# Patient Record
Sex: Female | Born: 1955 | Race: White | Hispanic: No | Marital: Single | Smoking: Former smoker
Health system: Southern US, Community
[De-identification: ages and names within clinical notes are randomized; demographics above are authoritative.]

## PROBLEM LIST (undated history)

## (undated) DIAGNOSIS — I10 Essential (primary) hypertension: Secondary | ICD-10-CM

## (undated) DIAGNOSIS — E119 Type 2 diabetes mellitus without complications: Secondary | ICD-10-CM

## (undated) DIAGNOSIS — E039 Hypothyroidism, unspecified: Secondary | ICD-10-CM

## (undated) DIAGNOSIS — Z9889 Other specified postprocedural states: Secondary | ICD-10-CM

## (undated) DIAGNOSIS — T7840XA Allergy, unspecified, initial encounter: Secondary | ICD-10-CM

## (undated) DIAGNOSIS — R112 Nausea with vomiting, unspecified: Secondary | ICD-10-CM

## (undated) DIAGNOSIS — D649 Anemia, unspecified: Secondary | ICD-10-CM

## (undated) DIAGNOSIS — E785 Hyperlipidemia, unspecified: Secondary | ICD-10-CM

## (undated) DIAGNOSIS — M1990 Unspecified osteoarthritis, unspecified site: Secondary | ICD-10-CM

## (undated) DIAGNOSIS — F329 Major depressive disorder, single episode, unspecified: Secondary | ICD-10-CM

## (undated) DIAGNOSIS — F419 Anxiety disorder, unspecified: Secondary | ICD-10-CM

## (undated) DIAGNOSIS — H269 Unspecified cataract: Secondary | ICD-10-CM

## (undated) DIAGNOSIS — F32A Depression, unspecified: Secondary | ICD-10-CM

## (undated) DIAGNOSIS — K219 Gastro-esophageal reflux disease without esophagitis: Secondary | ICD-10-CM

## (undated) DIAGNOSIS — R519 Headache, unspecified: Secondary | ICD-10-CM

## (undated) DIAGNOSIS — D751 Secondary polycythemia: Principal | ICD-10-CM

## (undated) DIAGNOSIS — H35039 Hypertensive retinopathy, unspecified eye: Secondary | ICD-10-CM

## (undated) DIAGNOSIS — E079 Disorder of thyroid, unspecified: Secondary | ICD-10-CM

## (undated) DIAGNOSIS — R51 Headache: Secondary | ICD-10-CM

## (undated) DIAGNOSIS — R002 Palpitations: Principal | ICD-10-CM

## (undated) HISTORY — PX: COLONOSCOPY: SHX174

## (undated) HISTORY — PX: BUNIONECTOMY: SHX129

## (undated) HISTORY — DX: Disorder of thyroid, unspecified: E07.9

## (undated) HISTORY — PX: TONSILLECTOMY: SUR1361

## (undated) HISTORY — DX: Gastro-esophageal reflux disease without esophagitis: K21.9

## (undated) HISTORY — DX: Depression, unspecified: F32.A

## (undated) HISTORY — DX: Unspecified osteoarthritis, unspecified site: M19.90

## (undated) HISTORY — PX: POLYPECTOMY: SHX149

## (undated) HISTORY — DX: Secondary polycythemia: D75.1

## (undated) HISTORY — DX: Anxiety disorder, unspecified: F41.9

## (undated) HISTORY — DX: Unspecified cataract: H26.9

## (undated) HISTORY — DX: Allergy, unspecified, initial encounter: T78.40XA

## (undated) HISTORY — DX: Essential (primary) hypertension: I10

## (undated) HISTORY — DX: Hyperlipidemia, unspecified: E78.5

## (undated) HISTORY — DX: Type 2 diabetes mellitus without complications: E11.9

## (undated) HISTORY — DX: Palpitations: R00.2

## (undated) HISTORY — DX: Hypertensive retinopathy, unspecified eye: H35.039

## (undated) HISTORY — DX: Major depressive disorder, single episode, unspecified: F32.9

---

## 1997-09-03 ENCOUNTER — Other Ambulatory Visit: Admission: RE | Admit: 1997-09-03 | Discharge: 1997-09-03 | Payer: Self-pay | Admitting: *Deleted

## 1998-01-19 ENCOUNTER — Ambulatory Visit (HOSPITAL_COMMUNITY): Admission: RE | Admit: 1998-01-19 | Discharge: 1998-01-19 | Payer: Self-pay | Admitting: *Deleted

## 1998-01-25 ENCOUNTER — Ambulatory Visit (HOSPITAL_COMMUNITY): Admission: RE | Admit: 1998-01-25 | Discharge: 1998-01-25 | Payer: Self-pay | Admitting: *Deleted

## 1998-07-05 ENCOUNTER — Encounter: Payer: Self-pay | Admitting: Internal Medicine

## 1998-07-05 ENCOUNTER — Ambulatory Visit (HOSPITAL_COMMUNITY): Admission: RE | Admit: 1998-07-05 | Discharge: 1998-07-05 | Payer: Self-pay | Admitting: Internal Medicine

## 1998-08-04 ENCOUNTER — Ambulatory Visit (HOSPITAL_COMMUNITY): Admission: RE | Admit: 1998-08-04 | Discharge: 1998-08-04 | Payer: Self-pay | Admitting: *Deleted

## 1998-09-23 ENCOUNTER — Other Ambulatory Visit: Admission: RE | Admit: 1998-09-23 | Discharge: 1998-09-23 | Payer: Self-pay | Admitting: *Deleted

## 1999-03-15 ENCOUNTER — Ambulatory Visit (HOSPITAL_COMMUNITY): Admission: RE | Admit: 1999-03-15 | Discharge: 1999-03-15 | Payer: Self-pay | Admitting: *Deleted

## 1999-10-23 ENCOUNTER — Other Ambulatory Visit: Admission: RE | Admit: 1999-10-23 | Discharge: 1999-10-23 | Payer: Self-pay | Admitting: *Deleted

## 2000-04-01 ENCOUNTER — Encounter: Payer: Self-pay | Admitting: Internal Medicine

## 2000-04-01 ENCOUNTER — Ambulatory Visit (HOSPITAL_COMMUNITY): Admission: RE | Admit: 2000-04-01 | Discharge: 2000-04-01 | Payer: Self-pay | Admitting: Internal Medicine

## 2001-01-02 ENCOUNTER — Other Ambulatory Visit: Admission: RE | Admit: 2001-01-02 | Discharge: 2001-01-02 | Payer: Self-pay | Admitting: Internal Medicine

## 2001-01-06 ENCOUNTER — Encounter: Payer: Self-pay | Admitting: Internal Medicine

## 2001-01-06 ENCOUNTER — Ambulatory Visit (HOSPITAL_COMMUNITY): Admission: RE | Admit: 2001-01-06 | Discharge: 2001-01-06 | Payer: Self-pay | Admitting: Internal Medicine

## 2001-04-03 ENCOUNTER — Ambulatory Visit (HOSPITAL_COMMUNITY): Admission: RE | Admit: 2001-04-03 | Discharge: 2001-04-03 | Payer: Self-pay | Admitting: Internal Medicine

## 2001-04-03 ENCOUNTER — Encounter: Payer: Self-pay | Admitting: Internal Medicine

## 2001-04-10 ENCOUNTER — Encounter: Payer: Self-pay | Admitting: Internal Medicine

## 2001-04-10 ENCOUNTER — Encounter: Admission: RE | Admit: 2001-04-10 | Discharge: 2001-04-10 | Payer: Self-pay | Admitting: Internal Medicine

## 2002-01-05 ENCOUNTER — Other Ambulatory Visit: Admission: RE | Admit: 2002-01-05 | Discharge: 2002-01-05 | Payer: Self-pay | Admitting: Internal Medicine

## 2002-04-13 ENCOUNTER — Encounter: Payer: Self-pay | Admitting: Internal Medicine

## 2002-04-13 ENCOUNTER — Encounter: Admission: RE | Admit: 2002-04-13 | Discharge: 2002-04-13 | Payer: Self-pay | Admitting: Internal Medicine

## 2004-01-11 ENCOUNTER — Encounter: Admission: RE | Admit: 2004-01-11 | Discharge: 2004-01-11 | Payer: Self-pay | Admitting: Internal Medicine

## 2004-02-02 ENCOUNTER — Ambulatory Visit: Admission: RE | Admit: 2004-02-02 | Discharge: 2004-02-02 | Payer: Self-pay | Admitting: Internal Medicine

## 2004-02-10 ENCOUNTER — Ambulatory Visit (HOSPITAL_COMMUNITY): Admission: RE | Admit: 2004-02-10 | Discharge: 2004-02-10 | Payer: Self-pay | Admitting: Internal Medicine

## 2004-05-24 ENCOUNTER — Other Ambulatory Visit: Admission: RE | Admit: 2004-05-24 | Discharge: 2004-05-24 | Payer: Self-pay | Admitting: Internal Medicine

## 2005-01-15 HISTORY — PX: GASTRIC BYPASS: SHX52

## 2005-03-06 ENCOUNTER — Encounter: Admission: RE | Admit: 2005-03-06 | Discharge: 2005-03-06 | Payer: Self-pay | Admitting: Internal Medicine

## 2005-10-20 ENCOUNTER — Emergency Department (HOSPITAL_COMMUNITY): Admission: EM | Admit: 2005-10-20 | Discharge: 2005-10-20 | Payer: Self-pay | Admitting: Emergency Medicine

## 2006-04-08 ENCOUNTER — Other Ambulatory Visit: Admission: RE | Admit: 2006-04-08 | Discharge: 2006-04-08 | Payer: Self-pay | Admitting: Internal Medicine

## 2006-05-24 ENCOUNTER — Encounter: Admission: RE | Admit: 2006-05-24 | Discharge: 2006-05-24 | Payer: Self-pay | Admitting: Internal Medicine

## 2007-05-26 ENCOUNTER — Encounter: Admission: RE | Admit: 2007-05-26 | Discharge: 2007-05-26 | Payer: Self-pay | Admitting: Internal Medicine

## 2007-10-30 ENCOUNTER — Emergency Department (HOSPITAL_COMMUNITY): Admission: EM | Admit: 2007-10-30 | Discharge: 2007-10-30 | Payer: Self-pay | Admitting: Family Medicine

## 2008-02-02 ENCOUNTER — Ambulatory Visit (HOSPITAL_COMMUNITY): Admission: RE | Admit: 2008-02-02 | Discharge: 2008-02-02 | Payer: Self-pay | Admitting: Internal Medicine

## 2008-06-07 ENCOUNTER — Encounter: Admission: RE | Admit: 2008-06-07 | Discharge: 2008-06-07 | Payer: Self-pay | Admitting: Internal Medicine

## 2008-06-30 ENCOUNTER — Other Ambulatory Visit: Admission: RE | Admit: 2008-06-30 | Discharge: 2008-06-30 | Payer: Self-pay | Admitting: Internal Medicine

## 2009-12-27 ENCOUNTER — Encounter
Admission: RE | Admit: 2009-12-27 | Discharge: 2009-12-27 | Payer: Self-pay | Source: Home / Self Care | Attending: Internal Medicine | Admitting: Internal Medicine

## 2010-10-10 ENCOUNTER — Other Ambulatory Visit (HOSPITAL_COMMUNITY)
Admission: RE | Admit: 2010-10-10 | Discharge: 2010-10-10 | Disposition: A | Discharge status: Home / Self Care | Payer: BC Managed Care – PPO | Source: Ambulatory Visit | Attending: Family Medicine | Admitting: Family Medicine

## 2010-10-10 DIAGNOSIS — Z124 Encounter for screening for malignant neoplasm of cervix: Secondary | ICD-10-CM | POA: Insufficient documentation

## 2011-01-15 ENCOUNTER — Other Ambulatory Visit: Payer: Self-pay | Admitting: Family Medicine

## 2011-01-15 DIAGNOSIS — Z1231 Encounter for screening mammogram for malignant neoplasm of breast: Secondary | ICD-10-CM

## 2011-02-01 ENCOUNTER — Ambulatory Visit
Admission: RE | Admit: 2011-02-01 | Discharge: 2011-02-01 | Disposition: A | Discharge status: Home / Self Care | Payer: BC Managed Care – PPO | Source: Ambulatory Visit | Attending: Family Medicine | Admitting: Family Medicine

## 2011-02-01 DIAGNOSIS — Z1231 Encounter for screening mammogram for malignant neoplasm of breast: Secondary | ICD-10-CM

## 2012-08-08 ENCOUNTER — Other Ambulatory Visit: Payer: Self-pay

## 2012-08-08 DIAGNOSIS — Z1231 Encounter for screening mammogram for malignant neoplasm of breast: Secondary | ICD-10-CM

## 2012-08-22 ENCOUNTER — Ambulatory Visit
Admission: RE | Admit: 2012-08-22 | Discharge: 2012-08-22 | Disposition: A | Discharge status: Home / Self Care | Payer: BC Managed Care – PPO | Source: Ambulatory Visit

## 2012-08-22 DIAGNOSIS — Z1231 Encounter for screening mammogram for malignant neoplasm of breast: Secondary | ICD-10-CM

## 2012-08-26 ENCOUNTER — Other Ambulatory Visit: Payer: Self-pay | Admitting: Family Medicine

## 2012-08-26 DIAGNOSIS — R928 Other abnormal and inconclusive findings on diagnostic imaging of breast: Secondary | ICD-10-CM

## 2012-09-12 ENCOUNTER — Ambulatory Visit
Admission: RE | Admit: 2012-09-12 | Discharge: 2012-09-12 | Disposition: A | Discharge status: Home / Self Care | Payer: BC Managed Care – PPO | Source: Ambulatory Visit | Attending: Family Medicine | Admitting: Family Medicine

## 2012-09-12 DIAGNOSIS — R928 Other abnormal and inconclusive findings on diagnostic imaging of breast: Secondary | ICD-10-CM

## 2012-11-28 ENCOUNTER — Ambulatory Visit (INDEPENDENT_AMBULATORY_CARE_PROVIDER_SITE_OTHER): Payer: BC Managed Care – PPO

## 2012-11-28 VITALS — BP 124/72 | HR 84 | Resp 16 | Ht 65.0 in | Wt 240.0 lb

## 2012-11-28 DIAGNOSIS — M79609 Pain in unspecified limb: Secondary | ICD-10-CM

## 2012-11-28 DIAGNOSIS — M722 Plantar fascial fibromatosis: Secondary | ICD-10-CM

## 2012-11-28 DIAGNOSIS — M7750 Other enthesopathy of unspecified foot: Secondary | ICD-10-CM

## 2012-11-28 DIAGNOSIS — B351 Tinea unguium: Secondary | ICD-10-CM

## 2012-11-28 NOTE — Patient Instructions (Signed)

## 2012-11-28 NOTE — Progress Notes (Signed)
  Subjective:    Patient ID: Debbie Cochran, female    DOB: 02/27/1955, 57 y.o.   MRN: [id number] "I need to get new orthotics.  He wanted me to wait until my foot healed from Bunion surgery."  HPI no changes medication her health history    Review of Systems  Constitutional: Negative.   Respiratory: Negative.   Cardiovascular: Negative.   Gastrointestinal: Negative.   Endocrine: Negative.   Musculoskeletal: Negative.   Skin: Negative.   Allergic/Immunologic: Negative.   Neurological: Negative.   Hematological: Negative.   Psychiatric/Behavioral: Negative.   All other systems reviewed and are negative.       Objective:   Physical Exam Neurovascular status is intact with pedal pulses palpable. DP and PT postop over 4 bilateral. Epicritic and proprioceptive sensations intact and symmetric. There is pain along the mid arch plantar fascia right heel. There is also tenderness and discomfort Lisfranc's and mid tarsus area dorsal lateral aspect lateral column of the right foot in particular. Consistent with Lisfranc capsulitis arthropathy. Patient is been orthotics which are worn a knee replacement at this time. Patient is status post [place] bunionectomy right foot with good success good range of motion or flexion plantar flexion of the right great toe joint noted at this time. Well coapted incision.       Assessment & Plan:  Assessment good postop progress following bunionectomy right foot. 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 Plan at this time orthotics skin for new functional orthoses carried out consider some a flexible type device with Spenco top cover. Patient be contacted with the next 3-4 weeks when orthotics ready for fitting and dispensing in the interim maintain accommodative shoes wearing a 6 athletic shoes at the current time.  Alvan Dame DPM

## 2013-01-14 ENCOUNTER — Ambulatory Visit (INDEPENDENT_AMBULATORY_CARE_PROVIDER_SITE_OTHER): Payer: BC Managed Care – PPO

## 2013-01-14 VITALS — BP 122/69 | HR 85 | Resp 16 | Ht 65.0 in | Wt 235.0 lb

## 2013-01-14 DIAGNOSIS — M79609 Pain in unspecified limb: Secondary | ICD-10-CM

## 2013-01-14 DIAGNOSIS — M722 Plantar fascial fibromatosis: Secondary | ICD-10-CM

## 2013-01-14 DIAGNOSIS — M7750 Other enthesopathy of unspecified foot: Secondary | ICD-10-CM

## 2013-01-14 NOTE — Patient Instructions (Signed)

## 2013-01-14 NOTE — Progress Notes (Signed)
   Subjective:    Patient ID: Debbie Cochran, female    DOB: [birth year], 57 y.o.   MRN: [id number]  HPI "It's good."  Patient presents to pick up orthotics, instructions were given.    Review of Systems deferred at this     Objective:   Physical Exam Neurovascular status intact pedal pulses palpable DP and PT +2/4 bilateral Refill timed 3-4 seconds. There is tenderness on palpation Magan plantar fascia and Lisfranc capsulitis bilateral. Patient settled orthotics the orthotics fit and contour well at this time patient does have some dress shoes correction to narrow on her patient is a bunion deformity left as opposed right and needs wider more coming shoes. Orthotics are dispensed with break in wearing instructions at this time.       Assessment & Plan:  Assessment plantar fasciitis/heel spur syndrome as well as Lisfranc's capsulitis bilateral orthotics are dispensed with instructions for break-in use recheck in one to 2 months for adjustments if needed. Recommended Tylenol or Advil during braking in an as-needed basis. Also suggested some new shoes and take the orthotics with her which is fitted for new shoes.  Alvan Dame DPM

## 2013-08-12 ENCOUNTER — Other Ambulatory Visit: Payer: Self-pay

## 2013-08-12 DIAGNOSIS — Z1231 Encounter for screening mammogram for malignant neoplasm of breast: Secondary | ICD-10-CM

## 2013-08-14 ENCOUNTER — Other Ambulatory Visit: Payer: Self-pay | Admitting: Family Medicine

## 2013-08-14 ENCOUNTER — Other Ambulatory Visit (HOSPITAL_COMMUNITY)
Admission: RE | Admit: 2013-08-14 | Discharge: 2013-08-14 | Disposition: A | Discharge status: Home / Self Care | Payer: BC Managed Care – PPO | Source: Ambulatory Visit | Attending: Family Medicine | Admitting: Family Medicine

## 2013-08-14 DIAGNOSIS — Z124 Encounter for screening for malignant neoplasm of cervix: Secondary | ICD-10-CM | POA: Insufficient documentation

## 2013-08-18 ENCOUNTER — Telehealth: Payer: Self-pay | Admitting: Hematology and Oncology

## 2013-08-18 LAB — CYTOLOGY - PAP

## 2013-08-18 NOTE — Telephone Encounter (Signed)
LEFT MESSAGE FOR PATIENT AND GAVE NP APPT FOR 08/18 @ 11 W/DR. [place].

## 2013-08-27 ENCOUNTER — Ambulatory Visit: Payer: BC Managed Care – PPO

## 2013-08-31 ENCOUNTER — Ambulatory Visit
Admission: RE | Admit: 2013-08-31 | Discharge: 2013-08-31 | Disposition: A | Discharge status: Home / Self Care | Payer: BC Managed Care – PPO | Source: Ambulatory Visit

## 2013-08-31 DIAGNOSIS — Z1231 Encounter for screening mammogram for malignant neoplasm of breast: Secondary | ICD-10-CM

## 2013-09-01 ENCOUNTER — Ambulatory Visit: Payer: BC Managed Care – PPO

## 2013-09-01 ENCOUNTER — Ambulatory Visit: Payer: BC Managed Care – PPO | Admitting: Hematology and Oncology

## 2013-09-10 ENCOUNTER — Ambulatory Visit: Payer: BC Managed Care – PPO

## 2013-09-10 ENCOUNTER — Encounter: Payer: Self-pay | Admitting: Hematology and Oncology

## 2013-09-10 ENCOUNTER — Telehealth: Payer: Self-pay | Admitting: Hematology and Oncology

## 2013-09-10 ENCOUNTER — Ambulatory Visit (HOSPITAL_BASED_OUTPATIENT_CLINIC_OR_DEPARTMENT_OTHER): Payer: BC Managed Care – PPO | Admitting: Hematology and Oncology

## 2013-09-10 ENCOUNTER — Ambulatory Visit (HOSPITAL_BASED_OUTPATIENT_CLINIC_OR_DEPARTMENT_OTHER): Payer: BC Managed Care – PPO

## 2013-09-10 VITALS — BP 143/67 | HR 84 | Temp 99.0°F | Resp 17 | Ht 65.0 in | Wt 247.1 lb

## 2013-09-10 DIAGNOSIS — Z9884 Bariatric surgery status: Secondary | ICD-10-CM

## 2013-09-10 DIAGNOSIS — D751 Secondary polycythemia: Secondary | ICD-10-CM

## 2013-09-10 HISTORY — DX: Secondary polycythemia: D75.1

## 2013-09-10 LAB — CBC & DIFF AND RETIC
BASO%: 0.4 % (ref 0.0–2.0)
Basophils Absolute: 0 10*3/uL (ref 0.0–0.1)
EOS ABS: 0.4 10*3/uL (ref 0.0–0.5)
EOS%: 3.6 % (ref 0.0–7.0)
HCT: 43.4 % (ref 34.8–46.6)
HGB: 14.9 g/dL (ref 11.6–15.9)
Immature Retic Fract: 6.7 % (ref 1.60–10.00)
LYMPH%: 37.3 % (ref 14.0–49.7)
MCH: 30.6 pg (ref 25.1–34.0)
MCHC: 34.3 g/dL (ref 31.5–36.0)
MCV: 89.1 fL (ref 79.5–101.0)
MONO#: 0.8 10*3/uL (ref 0.1–0.9)
MONO%: 8 % (ref 0.0–14.0)
NEUT%: 50.7 % (ref 38.4–76.8)
NEUTROS ABS: 5 10*3/uL (ref 1.5–6.5)
Platelets: 323 10*3/uL (ref 145–400)
RBC: 4.87 10*6/uL (ref 3.70–5.45)
RDW: 11.9 % (ref 11.2–14.5)
RETIC %: 2.36 % — AB (ref 0.70–2.10)
RETIC CT ABS: 114.93 10*3/uL — AB (ref 33.70–90.70)
WBC: 10 10*3/uL (ref 3.9–10.3)
lymph#: 3.7 10*3/uL — ABNORMAL HIGH (ref 0.9–3.3)

## 2013-09-10 NOTE — Assessment & Plan Note (Signed)
The cause is unknown, could be due to undiagnosed obstructive sleep apnea. At the time of dictation, her repeat CBC came back within normal limits. I am waiting for her iron studies. We'll call the patient with tess results.

## 2013-09-10 NOTE — Progress Notes (Signed)
Checked in new pt with no financial concerns at this time.  Pt informed me that she is here for a blood issue so no financial help is needed.  I gave her Raquel's card for any questions or concerns.

## 2013-09-10 NOTE — Assessment & Plan Note (Signed)
She is currently taking iron replacement therapy. I will call the patient with test results.

## 2013-09-10 NOTE — Progress Notes (Signed)
[facility] NOTE  Patient Care Team: Gerrit Heck, MD as PCP - General (Family Medicine)  CHIEF COMPLAINTS/PURPOSE OF CONSULTATION:  Erythrocytosis  HISTORY OF PRESENTING ILLNESS:  Debbie Cochran 58 y.o. female is here because of elevated hemoglobin.  She was found to have abnormal CBC from routine blood work monitoring. She had history of gastric bypass and was noted to have severe pain deficiency anemia last year. She was placed on oral iron supplements and underwent EGD and colonoscopy with no source of bleeding found. Recently, she was told to have polycythemia the hemoglobin at 16.4. She denies intermittent headaches, shortness of breath on exertion, frequent leg cramps and occasional chest pain.  She never suffer from diagnosis of blood clot.  There is no prior diagnosis of obstructive sleep apnea. The patient denies weight loss or skin itching. MEDICAL HISTORY:  Past Medical History  Diagnosis Date  . Allergy   . Thyroid disease   . GERD (gastroesophageal reflux disease)   . Hypertension   . Hyperlipidemia   . Depression   . Anxiety   . Arthritis   . Polycythemia, secondary 09/10/2013    SURGICAL HISTORY: Past Surgical History  Procedure Laterality Date  . Bunionectomy Right   . 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 Tonsillectomy      SOCIAL HISTORY: History   Social History  . Marital Status: Single    Spouse Name: N/A    Number of Children: N/A  . Years of Education: N/A   Occupational History  . Not on file.   Social History Main Topics  . Smoking status: Former Smoker -- 0.01 packs/day for 35 years    Types: Cigarettes    Quit date: 06/15/2013  . Smokeless tobacco: Never Used  . Alcohol Use: Yes     Comment: once in a while not daily  . Drug Use: No  . Sexual Activity: Not on file   Other Topics Concern  . Not on file   Social History Narrative  . No narrative on file    FAMILY HISTORY: Family History   Problem Relation Age of Onset  . Dementia Mother   . Dementia Father   . Arthritis Father     ALLERGIES:  is allergic to penicillins.  MEDICATIONS:  Current Outpatient Prescriptions  Medication Sig Dispense Refill  . acetaminophen (TYLENOL) 500 MG tablet Take 1,000 mg by mouth daily.      Marland Kitchen ALPRAZolam (XANAX) 0.25 MG tablet Take 0.25 mg by mouth as needed for anxiety.      Marland Kitchen b complex vitamins tablet Take 1 tablet by mouth daily.      . Calcium Carb-Cholecalciferol (CALCIUM 600 + D PO) Take by mouth daily.      . cetirizine-pseudoephedrine (ZYRTEC-D) 5-120 MG per tablet Take 1 tablet by mouth daily as needed for allergies.      . cholecalciferol (VITAMIN D) 1000 UNITS tablet Take 1,000 Units by mouth daily.      . DULoxetine (CYMBALTA) 60 MG capsule Take 60 mg by mouth daily.      . ferrous sulfate 325 (65 FE) MG tablet Take 325 mg by mouth 2 (two) times daily with a meal.      . fluticasone (VERAMYST) 27.5 MCG/SPRAY nasal spray Place 2 sprays into the nose daily.      . folic acid (FOLVITE) 161 MCG tablet Take 400 mcg by mouth daily.      Marland Kitchen levothyroxine (SYNTHROID,  LEVOTHROID) 75 MCG tablet Take 75 mcg by mouth daily before breakfast.      . lovastatin (MEVACOR) 20 MG tablet Take 20 mg by mouth at bedtime.      . magnesium gluconate (MAGONATE) 500 MG tablet Take 500 mg by mouth daily.      . Multiple Vitamin (MULTIVITAMIN) tablet Take 1 tablet by mouth daily.      Marland Kitchen omeprazole (PRILOSEC) 20 MG capsule Take 20 mg by mouth daily.      . trandolapril (MAVIK) 2 MG tablet Take 2 mg by mouth daily.      Marland Kitchen triamterene-hydrochlorothiazide (MAXZIDE) 75-50 MG per tablet Take 0.5 tablets by mouth daily.       . verapamil (COVERA HS) 240 MG (CO) 24 hr tablet Take 240 mg by mouth daily after breakfast.      . vitamin C (ASCORBIC ACID) 500 MG tablet Take 500 mg by mouth daily.       No current facility-administered medications for this visit.    REVIEW OF SYSTEMS:   Constitutional: Denies  fevers, chills or abnormal night sweats Eyes: Denies blurriness of vision, double vision or watery eyes Ears, nose, mouth, throat, and face: Denies mucositis or sore throat Respiratory: Denies cough, dyspnea or wheezes Cardiovascular: Denies palpitation, chest discomfort or lower extremity swelling Gastrointestinal:  Denies nausea, heartburn or change in bowel habits Skin: Denies abnormal skin rashes Lymphatics: Denies new lymphadenopathy or easy bruising Neurological:Denies numbness, tingling or new weaknesses Behavioral/Psych: Mood is stable, no new changes  All other systems were reviewed with the patient and are negative.  PHYSICAL EXAMINATION: ECOG PERFORMANCE STATUS: 0 - Asymptomatic  Filed Vitals:   09/10/13 1440  BP: 143/67  Pulse: 84  Temp: 99 F (37.2 C)  Resp: 17   Filed Weights   09/10/13 1440  Weight: 247 lb 1.6 oz (112.084 kg)    GENERAL:alert, no distress and comfortable. She is morbidly obese SKIN: skin color, texture, turgor are normal, no rashes or significant lesions EYES: normal, conjunctiva are pink and non-injected, sclera clear OROPHARYNX:no exudate, no erythema and lips, buccal mucosa, and tongue normal  NECK: supple, thyroid normal size, non-tender, without nodularity LYMPH:  no palpable lymphadenopathy in the cervical, axillary or inguinal LUNGS: clear to auscultation and percussion with normal breathing effort HEART: regular rate & rhythm and no murmurs and no lower extremity edema ABDOMEN:abdomen soft, non-tender and normal bowel sounds Musculoskeletal:no cyanosis of digits and no clubbing  PSYCH: alert & oriented x 3 with fluent speech NEURO: no focal motor/sensory deficits  LABORATORY DATA:  I have reviewed the data as listed Recent Results (from the past 2160 hour(s))  CYTOLOGY - PAP     Status: None   Collection Time    08/14/13 12:00 AM      Result Value Ref Range   CYTOLOGY - PAP PAP RESULT    CBC & DIFF AND RETIC     Status: Abnormal    Collection Time    09/10/13  3:34 PM      Result Value Ref Range   WBC 10.0  3.9 - 10.3 10e3/uL   NEUT# 5.0  1.5 - 6.5 10e3/uL   HGB 14.9  11.6 - 15.9 g/dL   HCT 43.4  34.8 - 46.6 %   Platelets 323  145 - 400 10e3/uL   MCV 89.1  79.5 - 101.0 fL   MCH 30.6  25.1 - 34.0 pg   MCHC 34.3  31.5 - 36.0 g/dL   RBC 4.87  3.70 - 5.45 10e6/uL   RDW 11.9  11.2 - 14.5 %   lymph# 3.7 (*) 0.9 - 3.3 10e3/uL   MONO# 0.8  0.1 - 0.9 10e3/uL   Eosinophils Absolute 0.4  0.0 - 0.5 10e3/uL   Basophils Absolute 0.0  0.0 - 0.1 10e3/uL   NEUT% 50.7  38.4 - 76.8 %   LYMPH% 37.3  14.0 - 49.7 %   MONO% 8.0  0.0 - 14.0 %   EOS% 3.6  0.0 - 7.0 %   BASO% 0.4  0.0 - 2.0 %   Retic % 2.36 (*) 0.70 - 2.10 %   Retic Ct Abs 114.93 (*) 33.70 - 90.70 10e3/uL   Immature Retic Fract 6.70  1.60 - 10.00 %    RADIOGRAPHIC STUDIES: I have personally reviewed the radiological images as listed and agreed with the findings in the report. Mm Digital Screening Bilateral  09/01/2013   CLINICAL DATA:  Screening. Family history of breast cancer diagnosed in her sister at 48 and her mother at 70.  EXAM: DIGITAL SCREENING BILATERAL MAMMOGRAM WITH CAD  COMPARISON:  Previous exam(s).  ACR Breast Density Category b: There are scattered areas of fibroglandular density.  FINDINGS: There are no findings suspicious for malignancy. Images were processed with CAD.  IMPRESSION: 1. No mammographic evidence of malignancy. 2. Based on the Tyrer-Cuzick risk assessment model, the patient has a 22.2% lifetime risk of developing breast cancer. By the recommendations of the [place], annual MRI and annual mammography are recommended. 3. A result letter of this screening mammogram will be mailed directly to the patient.  RECOMMENDATION: 1. Screening mammogram in one year. (Code:SM-B-01Y) 2. Annual MRI is recommended.  See above  BI-RADS CATEGORY  1: Negative.   Electronically Signed   By: Shon Hale M.D.   On: 09/01/2013 13:48     ASSESSMENT & PLAN Polycythemia, secondary The cause is unknown, could be due to undiagnosed obstructive sleep apnea. At the time of dictation, her repeat CBC came back within normal limits. I am waiting for her iron studies. We'll call the patient with tess results.   S/P gastric bypass She is currently taking iron replacement therapy. I will call the patient with test results.

## 2013-09-10 NOTE — Telephone Encounter (Signed)
gv and printed appt sched and av sfo rpt for Sept....sent pt to lab °

## 2013-09-11 LAB — FERRITIN CHCC: FERRITIN: 205 ng/mL (ref 9–269)

## 2013-09-14 ENCOUNTER — Telehealth: Payer: Self-pay | Admitting: Hematology and Oncology

## 2013-09-14 NOTE — Telephone Encounter (Signed)
I spoke with the patient and deliver test results over the phone. Polycythemia has resolved. Ferritin level is over 200 and I recommend she can stop aspirin and iron supplement. I recommend we check blood work as PCP in 6 months. I address all her questions and concerns.

## 2013-09-22 ENCOUNTER — Ambulatory Visit: Payer: BC Managed Care – PPO | Admitting: Hematology and Oncology

## 2014-07-27 ENCOUNTER — Other Ambulatory Visit: Payer: Self-pay

## 2014-07-27 DIAGNOSIS — Z1231 Encounter for screening mammogram for malignant neoplasm of breast: Secondary | ICD-10-CM

## 2014-09-03 ENCOUNTER — Ambulatory Visit
Admission: RE | Admit: 2014-09-03 | Discharge: 2014-09-03 | Disposition: A | Discharge status: Home / Self Care | Payer: BC Managed Care – PPO | Source: Ambulatory Visit

## 2014-09-03 DIAGNOSIS — Z1231 Encounter for screening mammogram for malignant neoplasm of breast: Secondary | ICD-10-CM

## 2014-09-21 ENCOUNTER — Other Ambulatory Visit: Payer: Self-pay | Admitting: Obstetrics and Gynecology

## 2014-09-21 ENCOUNTER — Other Ambulatory Visit (HOSPITAL_COMMUNITY)
Admission: RE | Admit: 2014-09-21 | Discharge: 2014-09-21 | Disposition: A | Discharge status: Home / Self Care | Payer: BC Managed Care – PPO | Source: Ambulatory Visit | Attending: Obstetrics and Gynecology | Admitting: Obstetrics and Gynecology

## 2014-09-21 DIAGNOSIS — Z1151 Encounter for screening for human papillomavirus (HPV): Secondary | ICD-10-CM | POA: Insufficient documentation

## 2014-09-21 DIAGNOSIS — Z01411 Encounter for gynecological examination (general) (routine) with abnormal findings: Secondary | ICD-10-CM | POA: Diagnosis present

## 2014-09-23 LAB — CYTOLOGY - PAP

## 2014-10-06 ENCOUNTER — Other Ambulatory Visit: Payer: Self-pay | Admitting: Obstetrics and Gynecology

## 2015-01-03 ENCOUNTER — Telehealth: Payer: Self-pay | Admitting: Cardiovascular Disease

## 2015-01-03 NOTE — Telephone Encounter (Signed)
Received records from [place] for appointment on 01/21/15 with Dr Oval Linsey.  Records given to [facility] (medical records) for Dr Blenda Mounts schedule on 01/21/15. lp

## 2015-01-21 ENCOUNTER — Ambulatory Visit: Payer: BC Managed Care – PPO | Admitting: Cardiovascular Disease

## 2015-01-31 NOTE — Progress Notes (Signed)
Cardiology Office Note   Date:  02/01/2015   ID:  [name], DOB 19-Sep-1955, MRN [id number]  PCP:  Debbie Heck, MD  Cardiologist:   Sharol Harness, MD   Chief Complaint  Patient presents with  . New Patient (Initial Visit)    fluttering, occasional pinching, no shortness of breath, no edema, no pain or cramping in legs, occassional vertigo      History of Present Illness: Debbie Cochran is a 60 y.o. female with hypertension, hyperlipidemia and s/p gastric bypass surgery who presents for an evaluation of first degree heart block and palpitations.  Debbie Cochran saw her PCP, Debbie Grove, PA, on 12/15/14.  At that appointment she complained of lightheadedness and irregular heartbeat. Her exam was unremarkable. It was felt that her symptoms may be related to vertigo and she was started on meclizine, as the symptoms started after a recent sinus infection.  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   Debbie Cochran has ben noting intermittent fluttering in her chest.  The flutering occurs in the setting of panic attacks and also when she is feeling well.  When it occurs she feels a 1/10 pinching sensation in her chest.  She has been experiencing panic attacks at work due to an extremely stressful work environment.  These are also associated with palpitations and chest discomfort.  She denies shortness of breath, nausea, vomiting or diaphoresis.  Debbie Cochran notes that while she was on winter break she went for 4-5 days without any episodes. However, upon returning to school her symptoms recurred.  Each episode lasts for seconds and occurrs up to 6-8 times per day.  She denies lower extremity edema, orthopnea or PND.    Debbie Cochran does not get any regular exercise.  She walks a lot at work and this does not worsen her symptoms.   Past Medical History  Diagnosis Date  . Allergy   . Thyroid disease   . GERD  (gastroesophageal reflux disease)   . Hypertension   . Hyperlipidemia   . Depression   . Anxiety   . Arthritis   . Polycythemia, secondary 09/10/2013  . Essential hypertension 02/01/2015  . Palpitations 02/01/2015    Past Surgical History  Procedure Laterality Date  . Bunionectomy Right   . 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 Tonsillectomy       Current Outpatient Prescriptions  Medication Sig Dispense Refill  . acetaminophen (TYLENOL ARTHRITIS PAIN) 650 MG CR tablet Take 1,300 mg by mouth daily as needed for pain.    Marland Kitchen ALPRAZolam (XANAX) 0.25 MG tablet Take 0.25 mg by mouth 3 (three) times daily as needed for anxiety.     Marland Kitchen b complex vitamins tablet Take 1 tablet by mouth daily.    . busPIRone (BUSPAR) 5 MG tablet Take 1 tablet by mouth 2 (two) times daily. Take 1 tab bid    . Calcium Carb-Cholecalciferol (CALCIUM 600 + D PO) Take 1 tablet by mouth daily.     . cetirizine-pseudoephedrine (ZYRTEC-D) 5-120 MG per tablet Take 1 tablet by mouth daily as needed for allergies.    . cholecalciferol (VITAMIN D) 1000 UNITS tablet Take 1,000 Units by mouth daily.    . DULoxetine (CYMBALTA) 60 MG capsule Take 60 mg by mouth daily.    . fluticasone (VERAMYST) 27.5 MCG/SPRAY nasal spray Place 2 sprays into the nose daily.    . folic  acid (FOLVITE) 400 MCG tablet Take 400 mcg by mouth daily.    Marland Kitchen glucosamine-chondroitin 500-400 MG tablet Take 2 tablets by mouth daily.    Marland Kitchen levothyroxine (SYNTHROID, LEVOTHROID) 75 MCG tablet Take 75 mcg by mouth daily before breakfast.    . lovastatin (MEVACOR) 40 MG tablet Take 40 mg by mouth daily.  0  . magnesium gluconate (MAGONATE) 500 MG tablet Take 500 mg by mouth daily.    . Multiple Vitamin (MULTIVITAMIN) tablet Take 1 tablet by mouth daily.    Marland Kitchen omeprazole (PRILOSEC) 20 MG capsule Take 20 mg by mouth daily.    . trandolapril (MAVIK) 4 MG tablet Take 0.5 tablets by mouth daily.  0  . triamterene-hydrochlorothiazide (MAXZIDE) 75-50 MG per tablet Take  0.5 tablets by mouth daily.     . verapamil (COVERA HS) 240 MG (CO) 24 hr tablet Take 240 mg by mouth daily after breakfast.    . vitamin C (ASCORBIC ACID) 500 MG tablet Take 500 mg by mouth daily.     No current facility-administered medications for this visit.    Allergies:   Hyoscyamine sulfate and Penicillins    Social History:  The patient  reports that she quit smoking about 19 months ago. Her smoking use included Cigarettes. She has a .35 pack-year smoking history. She has never used smokeless tobacco. She reports that she drinks alcohol. She reports that she does not use illicit drugs.   Family History:  The patient's family history includes Arthritis in her father; Dementia in her father and mother.    ROS:  Please see the history of present illness.   Otherwise, review of systems are positive for  none.   All other systems are reviewed and negative.    PHYSICAL EXAM: VS:  BP 124/76 mmHg  Pulse 96  Ht 5\' 5"  (1.651 m)  Wt 84.936 kg (187 lb 4 oz)  BMI 31.16 kg/m2  LMP 01/05/2013 , BMI Body mass index is 31.16 kg/(m^2). GENERAL:  Well appearing HEENT:  Pupils equal round and reactive, fundi not visualized, oral mucosa unremarkable NECK:  No jugular venous distention, waveform within normal limits, carotid upstroke brisk and symmetric, no bruits, no thyromegaly LYMPHATICS:  No cervical adenopathy LUNGS:  Clear to auscultation bilaterally HEART:  Mostly regular with one early beat.  PMI not displaced or sustained,S1 and S2 within normal limits, no S3, no S4, no clicks, no rubs, no murmurs ABD:  Flat, positive bowel sounds normal in frequency in pitch, no bruits, no rebound, no guarding, no midline pulsatile mass, no hepatomegaly, no splenomegaly EXT:  2 plus pulses throughout, no edema, no cyanosis no clubbing SKIN:  No rashes no nodules NEURO:  Cranial nerves II through XII grossly intact, motor grossly intact throughout PSYCH:  Cognitively intact, oriented to person place  and time    EKG:  EKG is ordered today. The ekg ordered today demonstrates sinus rhythm. Rate 96 bpm.   Recent Labs: No results found for requested labs within last 365 days.    Lipid Panel No results found for: CHOL, TRIG, HDL, CHOLHDL, VLDL, LDLCALC, LDLDIRECT    Wt Readings from Last 3 Encounters:  02/01/15 84.936 kg (187 lb 4 oz)  09/10/13 112.084 kg (247 lb 1.6 oz)  01/14/13 106.595 kg (235 lb)      ASSESSMENT AND PLAN:   # Palpitations: Ms. Syfrett had one early beat on exam that likely represents a PVC or PAC. Otherwise, her exam and evaluation thus far has been unremarkable.  Given that her symptoms occur almost daily, we will check a 24 hour Holter to associate her symptoms with her electrocardiogram.  We will also check a basic metabolic panel, magnesium, CBC, TSH, and free T4.  # Hypertension: Blood pressure is well-controlled.  Continue Maxzide, trandolapril, and verapamil.  # Hyperlipidemia:  Continue lovastatin.  She states that her PCP checks her lipids and they have been well-controlled.  # Pre-op: Ms. Deshpande is scheduled for a D&C.  The patient does not have any unstable cardiac conditions.  Upon evaluation today, she can achieve 4 METs or greater without anginal symptoms.  According to [facility] and AHA guidelines, she requires no further cardiac workup prior to her noncardiac surgery and should be at acceptable risk.    Our service is available as necessary in the perioperative period.   Current medicines are reviewed at length with the patient today.  The patient does not have concerns regarding medicines.  The following changes have been made:  no change  Labs/ tests ordered today include:   Orders Placed This Encounter  Procedures  . Basic metabolic panel  . Magnesium  . CBC  . TSH  . T4, free  . Holter monitor - 24 hour  . EKG 12-Lead     Disposition:   FU with Hila Bolding C. Oval Linsey, MD, [facility] as needed.    This note was written with the assistance  of speech recognition software.  Please excuse any transcriptional errors.  Signed, Solimar Maiden C. Oval Linsey, MD, [facility]  02/01/2015 10:32 PM    [facility]

## 2015-02-01 ENCOUNTER — Encounter: Payer: Self-pay | Admitting: Cardiovascular Disease

## 2015-02-01 ENCOUNTER — Encounter (INDEPENDENT_AMBULATORY_CARE_PROVIDER_SITE_OTHER): Payer: BC Managed Care – PPO

## 2015-02-01 ENCOUNTER — Ambulatory Visit (INDEPENDENT_AMBULATORY_CARE_PROVIDER_SITE_OTHER): Payer: BC Managed Care – PPO | Admitting: Cardiovascular Disease

## 2015-02-01 VITALS — BP 124/76 | HR 96 | Ht 65.0 in | Wt 187.2 lb

## 2015-02-01 DIAGNOSIS — E785 Hyperlipidemia, unspecified: Secondary | ICD-10-CM | POA: Insufficient documentation

## 2015-02-01 DIAGNOSIS — R002 Palpitations: Secondary | ICD-10-CM

## 2015-02-01 DIAGNOSIS — E038 Other specified hypothyroidism: Secondary | ICD-10-CM | POA: Diagnosis not present

## 2015-02-01 DIAGNOSIS — Z79899 Other long term (current) drug therapy: Secondary | ICD-10-CM | POA: Diagnosis not present

## 2015-02-01 DIAGNOSIS — I10 Essential (primary) hypertension: Secondary | ICD-10-CM | POA: Diagnosis not present

## 2015-02-01 HISTORY — DX: Essential (primary) hypertension: I10

## 2015-02-01 HISTORY — DX: Palpitations: R00.2

## 2015-02-01 LAB — CBC
HCT: 46.6 % — ABNORMAL HIGH (ref 36.0–46.0)
Hemoglobin: 15.8 g/dL — ABNORMAL HIGH (ref 12.0–15.0)
MCH: 31 pg (ref 26.0–34.0)
MCHC: 33.9 g/dL (ref 30.0–36.0)
MCV: 91.6 fL (ref 78.0–100.0)
MPV: 9.4 fL (ref 8.6–12.4)
PLATELETS: 365 10*3/uL (ref 150–400)
RBC: 5.09 MIL/uL (ref 3.87–5.11)
RDW: 13.1 % (ref 11.5–15.5)
WBC: 12.5 10*3/uL — ABNORMAL HIGH (ref 4.0–10.5)

## 2015-02-01 NOTE — Patient Instructions (Signed)
Your physician recommends that you return for lab work in: today at [facility] first floor.  Your physician has recommended that you wear a 24 hour holter monitor. Holter monitors are medical devices that record the heart's electrical activity. Doctors most often use these monitors to diagnose arrhythmias. Arrhythmias are problems with the speed or rhythm of the heartbeat. The monitor is a small, portable device. You can wear one while you do your normal daily activities. This is usually used to diagnose what is causing palpitations/syncope (passing out).  Your physician recommends that you schedule a follow-up appointment in: AS NEEDED ---- YOU WILL BE CALLED WITH YOUR TEST RESULTS.

## 2015-02-02 ENCOUNTER — Telehealth: Payer: Self-pay

## 2015-02-02 LAB — T4, FREE: Free T4: 1.12 ng/dL (ref 0.80–1.80)

## 2015-02-02 LAB — BASIC METABOLIC PANEL
BUN: 17 mg/dL (ref 7–25)
CALCIUM: 10 mg/dL (ref 8.6–10.4)
CO2: 29 mmol/L (ref 20–31)
Chloride: 98 mmol/L (ref 98–110)
Creat: 0.82 mg/dL (ref 0.50–1.05)
GLUCOSE: 90 mg/dL (ref 65–99)
Potassium: 4.5 mmol/L (ref 3.5–5.3)
Sodium: 137 mmol/L (ref 135–146)

## 2015-02-02 LAB — TSH: TSH: 1.822 u[IU]/mL (ref 0.350–4.500)

## 2015-02-02 LAB — MAGNESIUM: MAGNESIUM: 2 mg/dL (ref 1.5–2.5)

## 2015-02-02 NOTE — Telephone Encounter (Signed)
Faxed signed clearance letter to [facility]. Patient is cleared for Hysteroscopy/D&C per Dr Oval Linsey. Office note faxed to [facility] as well.

## 2015-02-04 ENCOUNTER — Ambulatory Visit: Payer: BC Managed Care – PPO | Admitting: Cardiovascular Disease

## 2015-02-07 ENCOUNTER — Telehealth: Payer: Self-pay | Admitting: *Deleted

## 2015-02-07 NOTE — Telephone Encounter (Signed)
Left message to call back  Routed labs  to Dr Drema Dallas

## 2015-02-07 NOTE — Telephone Encounter (Signed)
-----   Message from Skeet Latch, MD sent at 02/07/2015 12:13 AM EST ----- Normal thyroid function, kidney function and electrolytes.  White blood cell count is slightly elevated, which can be a sign of infection.  Follow up with PCP.

## 2015-02-07 NOTE — Telephone Encounter (Signed)
Spoke to patient. Result given . Verbalized understanding Patient aware. Need to discuss lab results with Dr Drema Dallas  she states she will see Dr Drema Dallas' tomorrow

## 2015-02-09 NOTE — Patient Instructions (Addendum)
Your procedure is scheduled on:  Monday, Jan. 30, 2017  Enter through the [employer] [facility] at:  10:45 A.M.  Pick up the phone at the desk and dial [phone].  Call this number if you have problems the morning of surgery: [phone].  Remember: Do NOT eat food:  After Midnight Sunday Do NOT drink clear liquids after:  8:00 A.M. Day of surgery  Take these medicines the morning of surgery with a SIP OF WATER:  Buspar, Cymbalata, Synthroid, Mavik, Verapmil, and  Triamterene-Hydrochlorothiazide  Do NOT wear jewelry (body piercing), metal hair clips/bobby pins, make-up, or nail polish. Do NOT wear lotions, powders, or perfumes.  You may wear deoderant. Do NOT shave for 48 hours prior to surgery. Do NOT bring valuables to the hospital. Contacts, dentures, or bridgework may not be worn into surgery.  Have a responsible adult drive you home and stay with you for 24 hours after your procedureMavik)

## 2015-02-10 ENCOUNTER — Encounter (HOSPITAL_COMMUNITY)
Admission: RE | Admit: 2015-02-10 | Discharge: 2015-02-10 | Disposition: A | Discharge status: Home / Self Care | Payer: BC Managed Care – PPO | Source: Ambulatory Visit | Attending: Obstetrics and Gynecology | Admitting: Obstetrics and Gynecology

## 2015-02-10 ENCOUNTER — Encounter (HOSPITAL_COMMUNITY): Payer: Self-pay

## 2015-02-10 DIAGNOSIS — I10 Essential (primary) hypertension: Secondary | ICD-10-CM | POA: Diagnosis not present

## 2015-02-10 DIAGNOSIS — Z01812 Encounter for preprocedural laboratory examination: Secondary | ICD-10-CM | POA: Diagnosis present

## 2015-02-10 DIAGNOSIS — N950 Postmenopausal bleeding: Secondary | ICD-10-CM | POA: Insufficient documentation

## 2015-02-10 DIAGNOSIS — Z79899 Other long term (current) drug therapy: Secondary | ICD-10-CM | POA: Insufficient documentation

## 2015-02-10 DIAGNOSIS — K219 Gastro-esophageal reflux disease without esophagitis: Secondary | ICD-10-CM | POA: Insufficient documentation

## 2015-02-10 HISTORY — DX: Hypothyroidism, unspecified: E03.9

## 2015-02-10 HISTORY — DX: Headache, unspecified: R51.9

## 2015-02-10 HISTORY — DX: Anemia, unspecified: D64.9

## 2015-02-10 HISTORY — DX: Headache: R51

## 2015-02-10 LAB — BASIC METABOLIC PANEL
Anion gap: 9 (ref 5–15)
BUN: 21 mg/dL — AB (ref 6–20)
CALCIUM: 9.4 mg/dL (ref 8.9–10.3)
CHLORIDE: 95 mmol/L — AB (ref 101–111)
CO2: 30 mmol/L (ref 22–32)
CREATININE: 0.79 mg/dL (ref 0.44–1.00)
GFR calc Af Amer: >60 mL/min (ref >60)
GFR calc non Af Amer: >60 mL/min (ref >60)
Glucose, Bld: 75 mg/dL (ref 65–99)
Potassium: 4 mmol/L (ref 3.5–5.1)
SODIUM: 134 mmol/L — AB (ref 135–145)

## 2015-02-14 ENCOUNTER — Ambulatory Visit (HOSPITAL_COMMUNITY): Payer: BC Managed Care – PPO | Admitting: Certified Registered Nurse Anesthetist

## 2015-02-14 ENCOUNTER — Encounter (HOSPITAL_COMMUNITY)
Admission: RE | Disposition: A | Discharge status: Home / Self Care | Payer: Self-pay | Source: Ambulatory Visit | Attending: Obstetrics and Gynecology

## 2015-02-14 ENCOUNTER — Ambulatory Visit (HOSPITAL_COMMUNITY)
Admission: RE | Admit: 2015-02-14 | Discharge: 2015-02-14 | Disposition: A | Discharge status: Home / Self Care | Payer: BC Managed Care – PPO | Source: Ambulatory Visit | Attending: Obstetrics and Gynecology | Admitting: Obstetrics and Gynecology

## 2015-02-14 ENCOUNTER — Encounter (HOSPITAL_COMMUNITY): Payer: Self-pay | Admitting: Emergency Medicine

## 2015-02-14 DIAGNOSIS — K219 Gastro-esophageal reflux disease without esophagitis: Secondary | ICD-10-CM | POA: Insufficient documentation

## 2015-02-14 DIAGNOSIS — I10 Essential (primary) hypertension: Secondary | ICD-10-CM | POA: Diagnosis not present

## 2015-02-14 DIAGNOSIS — N950 Postmenopausal bleeding: Secondary | ICD-10-CM | POA: Diagnosis present

## 2015-02-14 DIAGNOSIS — N840 Polyp of corpus uteri: Secondary | ICD-10-CM | POA: Insufficient documentation

## 2015-02-14 DIAGNOSIS — Z87891 Personal history of nicotine dependence: Secondary | ICD-10-CM | POA: Insufficient documentation

## 2015-02-14 HISTORY — DX: Other specified postprocedural states: Z98.890

## 2015-02-14 HISTORY — PX: HYSTEROSCOPY W/D&C: SHX1775

## 2015-02-14 HISTORY — DX: Other specified postprocedural states: R11.2

## 2015-02-14 SURGERY — DILATATION AND CURETTAGE /HYSTEROSCOPY
Anesthesia: General

## 2015-02-14 MED ORDER — ONDANSETRON HCL 4 MG/2ML IJ SOLN
INTRAMUSCULAR | Status: AC
  Filled 2015-02-14: qty 2

## 2015-02-14 MED ORDER — MIDAZOLAM HCL 2 MG/2ML IJ SOLN
INTRAMUSCULAR | Status: DC | PRN
  Administered 2015-02-14: 2 mg via INTRAVENOUS

## 2015-02-14 MED ORDER — PROPOFOL 10 MG/ML IV BOLUS
INTRAVENOUS | Status: AC
  Filled 2015-02-14: qty 20

## 2015-02-14 MED ORDER — ONDANSETRON HCL 4 MG/2ML IJ SOLN
INTRAMUSCULAR | Status: DC | PRN
Start: 2015-02-14 — End: 2015-02-14
  Administered 2015-02-14: 4 mg via INTRAVENOUS

## 2015-02-14 MED ORDER — LACTATED RINGERS IV SOLN
INTRAVENOUS | Status: DC
  Administered 2015-02-14: 11:00:00 via INTRAVENOUS

## 2015-02-14 MED ORDER — FENTANYL CITRATE (PF) 100 MCG/2ML IJ SOLN
INTRAMUSCULAR | Status: AC
  Filled 2015-02-14: qty 2

## 2015-02-14 MED ORDER — PROMETHAZINE HCL 25 MG/ML IJ SOLN: 6.2500 mg | INTRAMUSCULAR | Status: DC | PRN

## 2015-02-14 MED ORDER — KETOROLAC TROMETHAMINE 30 MG/ML IJ SOLN
INTRAMUSCULAR | Status: AC
  Filled 2015-02-14: qty 1

## 2015-02-14 MED ORDER — LACTATED RINGERS IV SOLN: INTRAVENOUS | Status: DC

## 2015-02-14 MED ORDER — LIDOCAINE HCL (CARDIAC) 20 MG/ML IV SOLN
INTRAVENOUS | Status: DC | PRN
  Administered 2015-02-14: 100 mg via INTRAVENOUS

## 2015-02-14 MED ORDER — FENTANYL CITRATE (PF) 100 MCG/2ML IJ SOLN: 25.0000 ug | INTRAMUSCULAR | Status: DC | PRN

## 2015-02-14 MED ORDER — LIDOCAINE HCL (CARDIAC) 20 MG/ML IV SOLN
INTRAVENOUS | Status: AC
  Filled 2015-02-14: qty 5

## 2015-02-14 MED ORDER — FENTANYL CITRATE (PF) 100 MCG/2ML IJ SOLN
INTRAMUSCULAR | Status: DC | PRN
  Administered 2015-02-14: 50 ug via INTRAVENOUS
  Administered 2015-02-14 (×2): 25 ug via INTRAVENOUS

## 2015-02-14 MED ORDER — GLYCOPYRROLATE 0.2 MG/ML IJ SOLN
INTRAMUSCULAR | Status: AC
  Filled 2015-02-14: qty 1

## 2015-02-14 MED ORDER — DEXAMETHASONE SODIUM PHOSPHATE 10 MG/ML IJ SOLN
INTRAMUSCULAR | Status: AC
  Filled 2015-02-14: qty 1

## 2015-02-14 MED ORDER — KETOROLAC TROMETHAMINE 30 MG/ML IJ SOLN
INTRAMUSCULAR | Status: DC | PRN
  Administered 2015-02-14: 30 mg via INTRAVENOUS

## 2015-02-14 MED ORDER — IBUPROFEN 600 MG PO TABS: 600.0000 mg | ORAL_TABLET | Freq: Four times a day (QID) | ORAL | Status: AC | PRN

## 2015-02-14 MED ORDER — LIDOCAINE HCL 2 % IJ SOLN
INTRAMUSCULAR | Status: DC | PRN
  Administered 2015-02-14: 10 mL

## 2015-02-14 MED ORDER — MEPERIDINE HCL 25 MG/ML IJ SOLN: 6.2500 mg | INTRAMUSCULAR | Status: DC | PRN

## 2015-02-14 MED ORDER — LIDOCAINE HCL 2 % IJ SOLN
INTRAMUSCULAR | Status: AC
  Filled 2015-02-14: qty 20

## 2015-02-14 MED ORDER — MIDAZOLAM HCL 2 MG/2ML IJ SOLN
INTRAMUSCULAR | Status: AC
  Filled 2015-02-14: qty 2

## 2015-02-14 MED ORDER — DEXAMETHASONE SODIUM PHOSPHATE 10 MG/ML IJ SOLN
INTRAMUSCULAR | Status: DC | PRN
  Administered 2015-02-14: 10 mg via INTRAVENOUS

## 2015-02-14 MED ORDER — PROPOFOL 10 MG/ML IV BOLUS
INTRAVENOUS | Status: DC | PRN
  Administered 2015-02-14: 200 mg via INTRAVENOUS

## 2015-02-14 SURGICAL SUPPLY — 17 items
CANISTER SUCT 3000ML (MISCELLANEOUS) ×3 IMPLANT
CATH ROBINSON RED A/P 16FR (CATHETERS) ×3 IMPLANT
CLOTH BEACON ORANGE TIMEOUT ST (SAFETY) ×3 IMPLANT
CONTAINER PREFILL 10% NBF 60ML (FORM) ×6 IMPLANT
DEVICE MYOSURE REACH (MISCELLANEOUS) ×2 IMPLANT
DILATOR CANAL MILEX (MISCELLANEOUS) IMPLANT
GLOVE BIO SURGEON STRL SZ7 (GLOVE) ×3 IMPLANT
GLOVE BIOGEL PI IND STRL 7.0 (GLOVE) ×2 IMPLANT
GLOVE BIOGEL PI INDICATOR 7.0 (GLOVE) ×4
GOWN STRL REUS W/TWL LRG LVL3 (GOWN DISPOSABLE) ×6 IMPLANT
PACK VAGINAL MINOR WOMEN LF (CUSTOM PROCEDURE TRAY) ×3 IMPLANT
PAD OB MATERNITY 4.3X12.25 (PERSONAL CARE ITEMS) ×3 IMPLANT
SEAL ROD LENS SCOPE MYOSURE (ABLATOR) ×2 IMPLANT
TOWEL OR 17X24 6PK STRL BLUE (TOWEL DISPOSABLE) ×6 IMPLANT
TUBING AQUILEX INFLOW (TUBING) ×3 IMPLANT
TUBING AQUILEX OUTFLOW (TUBING) ×3 IMPLANT
WATER STERILE IRR 1000ML POUR (IV SOLUTION) ×3 IMPLANT

## 2015-02-14 NOTE — Anesthesia Postprocedure Evaluation (Signed)
Anesthesia Post Note  Patient: Debbie Cochran  Procedure(s) Performed: Procedure(s) (LRB): DILATATION AND CURETTAGE /HYSTEROSCOPY with MYOSURE (N/A)  Patient location during evaluation: PACU Anesthesia Type: General Level of consciousness: awake and alert Pain management: pain level controlled Vital Signs Assessment: post-procedure vital signs reviewed and stable Respiratory status: spontaneous breathing, nonlabored ventilation, respiratory function stable and patient connected to nasal cannula oxygen Cardiovascular status: blood pressure returned to baseline and stable Postop Assessment: no signs of nausea or vomiting Anesthetic complications: no    Last Vitals:  Filed Vitals:   02/14/15 1400 02/14/15 1445  BP: 132/75 136/72  Pulse: 76 76  Temp: 37.1 C 36.7 C  Resp: 12 16    Last Pain: There were no vitals filed for this visit.               Effie Berkshire

## 2015-02-14 NOTE — Transfer of Care (Signed)
Immediate Anesthesia Transfer of Care Note  Patient: Debbie Cochran  Procedure(s) Performed: Procedure(s): DILATATION AND CURETTAGE /HYSTEROSCOPY with MYOSURE (N/A)  Patient Location: PACU  Anesthesia Type:General  Level of Consciousness: awake, alert  and oriented  Airway & Oxygen Therapy: Patient Spontanous Breathing and Patient connected to nasal cannula oxygen  Post-op Assessment: Report given to RN, Post -op Vital signs reviewed and stable and Patient moving all extremities X 4  Post vital signs: Reviewed and stable  Last Vitals:  Filed Vitals:   02/14/15 1052  BP: 148/83  Pulse: 78  Temp: 36.5 C  Resp: 18    Complications: No apparent anesthesia complications

## 2015-02-14 NOTE — H&P (Signed)
History of Present Illness   General:  Pt had another episode of bleeding and went for about 2 weeks. Pt had some clots and bleeding was pretty heavy. Had to use tampons or pads. Has intermittently spotted since November. Started Buspar 2 weeks ago.   Current Medications  Taking   Multivitamin . Tablet 1 tablet Once daily   Glucosamine Chondroitin Complx(Glucosamine-Chondroitin)   B Complex   Folic Acid A999333 MCG Tablet 1 tablet Once a day   Arthritis Pain Reliever(Menthol) 650 MG Tablet Extended Release 2 tablets once a day if needed   Fluticasone Propionate 50 MCG/ACT Suspension 2 sprays Once a day   Calcium + Vit D(Dexbrompheniramine-PSE ER) 600-400 MG-IU 1 capsule once a day   Lovastatin 20 MG Tablet 1 tablet with a meal Once a day, Notes: DOSE CHANGE   Verapamil HCl 240 MG Tablet Extended Release 1 tablet every morning with food Once a day   Ciloxan(Ciprofloxacin HCl) 0.3 % Solution 1-2 drops into affected eye while awake every 2-4 hours   Trandolapril 4 MG Tablet 1/2 tablet Once a day   Triamterene-HCTZ 75-50 MG Tablet 1/2 tablet Once a day   Vitamin D (Cholecalciferol) 400 UNIT Capsule 2 capsules Once a day   Allergy D-12(Cetirizine-Pseudoephedrine ER) 5-120 MG Tablet Extended Release 12 Hour 1 tablet Twice a day   Meclizine HCl 25 MG Tablet 1 tablet as needed for dizziness twice a day   Lovastatin 40 MG Tablet 1 tablet with a meal Once a day   Xanax(ALPRAZolam) 0.25 MG Tablet 1 tablet daily only as needed for anxiety, Notes: BARNES   Levothyroxine Sodium 75 MCG Tablet TAKE 1 TABLET BY MOUTH DAILY   BusPIRone HCl 5 MG Tablet 1 tablet one a day for a weekthen twice a day   Cymbalta(DULoxetine HCl) 60 MG Capsule Delayed Release Particles 1 capsule Once a day   Omeprazole 20 MG Capsule Delayed Release 1 capsule Once a day   Medication List reviewed and reconciled with the patient    Past Medical History  Hypertension  Lactose intolerance, managed with diet  Obesity  IBS and  constipation   anxiety/ depression [facility] in the past)   Hypothyroidism  High cholesterol  GERD   Arthritis in back, hips and right foot.  Morbid obesity status post gastric bypass 2007 , maximum weight ~290  Abnormal fasting glucose  secondary polycythemia (Dr.Gorsuch)   Surgical History  Tonsils and adenoids 1968   Gastric Bypass at [facility], max weight ~290 06/2005   bunionectomy right 12/2011   Family History  Father: alive, dementia, arthritis  Mother: deceased 65 yrs, breast cancer, demential   Brother 1: alive, HTN, obesity  Sister 1: alive, breast cancer , diabetes   Social History  General:  Tobacco use  cigarettes: Former smoker Quit in year 2014 Tobacco history last updated 01/19/2015 no EXPOSURE TO PASSIVE SMOKE.  Alcohol: 1-2 per week.  Caffeine: yes.  no Recreational drug use.  no DIET.  Exercise: yes.  Marital Status: single.  Children: none.  OCCUPATION: Research officer, political party.    Gyn History  Periods : pmp.  LMP 07/10/14 and bleeding off and on since middle of August, last period before this one was 06/2013, 11/17/14 and bled for 2 weeks and was like a normal flow.  Denies H/O Birth control.  Last pap smear date 09/2014.  Last mammogram date 08/2014.  Abnormal pap smear assessed with colposcopy.    OB History  Never been pregnant per patient.  Allergies  Penicillin (for allergy): lips swell: Allergy   Hospitalization/Major Diagnostic Procedure  see surgery    Vital Signs  Wt 184, Wt change -.8 lb, Ht 64, BMI 31.58, Pulse sitting 91, BP sitting 124/61.   Physical Examination  GENERAL:  Patient appears alert and oriented.  General Appearance: well-appearing, well-developed, no acute distress.  Speech: clear.     Assessments   1. PMB (postmenopausal bleeding) - N95.0 (Primary)   Treatment  1. PMB (postmenopausal bleeding)  Referral OE:[id number] [facility] Reason:Precert Hyst/D&C for PMB. Needs preop  clearancePt sees Cardiology Dr. Oval Linsey on 1/17. Please send clearance note to PCP and cardiologist

## 2015-02-14 NOTE — Op Note (Signed)
Debbie Cochran, Debbie Cochran NO.:  [id number]  MEDICAL RECORD NO.:  [id number]  LOCATION:  [facility]                          FACILITY:  [facility]  PHYSICIAN:  Jola Schmidt, MD   DATE OF BIRTH:  January 31, 1955  DATE OF PROCEDURE:  02/14/2015 DATE OF DISCHARGE:  02/14/2015                              OPERATIVE REPORT   PREOPERATIVE DIAGNOSIS:  Postmenopausal bleeding.  POSTOPERATIVE DIAGNOSIS:  Postmenopausal bleeding, endometrial polyp.  PROCEDURE:  Hysteroscopy, D and C, with resection of polyp via MyoSure.  SURGEON:  Jola Schmidt, MD.  ASSISTANT:  None.  ANESTHESIA:  Local and general.  EBL:  Minimal.  HYSTEROSCOPY DEFICIT:  50 mL.  LOCAL:  10 mL of lidocaine without epi.  SPECIMEN:  Endometrial polyps and endometrial curettings.  DISPOSITION OF SPECIMEN:  To Pathology.  DISPOSITION:  To PACU, hemodynamically stable.  COMPLICATIONS:  None.  FINDINGS:  Two fundal endometrial polyps, right and left, questionable posterior fibroids, normal-shaped endometrial cavity.  Normal cervix and vagina.  Vaginal atrophy noted.  PROCEDURE IN DETAIL:  [name] was identified in the holding area. She was then taken to the operating room.  She underwent general anesthesia without complication.  She was placed in the dorsal lithotomy position, prepped and draped in a normal sterile fashion.  SCDs were on and operating.  She did not receive an IV antibiotics prior to procedure.  A time-out was performed.  Graves speculum was inserted into the vagina, but due to atrophy and narrowing, Terance Hart was used that was inserted.  Cervix was visualized. The anterior lip of the cervix was injected with lidocaine.  A single- tooth tenaculum was then applied to the anterior lip of the cervix.  The cervix was then dilated up to a 6 Hegar.  The hysteroscope was then advanced to reveal the findings above.  Once the polyps were noted, I called for the MyoSure, which required a new  scope.  I tried to dilate that with 7, but that was challenging.  I was able to get the MyoSure through successfully without any concerns of perforation.  The MyoSure was then used to resect the polyps in their entirety and I also did some curettage of all of the endometrial tissue under direct visualization.  At the end of the procedure, the outflow tubing might have contributed to poor picture quality, the cavity was unable to clear, but there was no obvious active bleeding when I was up close on the uterus, we did increase the pressure to 100.  All instruments were removed from the vagina.  There was some petechial hemorrhaging from the vagina that was not active.  All instruments, sponge and needle counts were correct x3.  The patient tolerated the procedure well.  She was taken to the recovery room in stable condition.     Jola Schmidt, MD     EBV/MEDQ  D:  02/14/2015  T:  02/14/2015  Job:  [id number]

## 2015-02-14 NOTE — Brief Op Note (Signed)
02/14/2015  1:13 PM  PATIENT:  Debbie Cochran  60 y.o. female  PRE-OPERATIVE DIAGNOSIS:  N95.0 PMB  POST-OPERATIVE DIAGNOSIS:  N95.0 PMB, endometrial polyps  PROCEDURE:  Procedure(s): DILATATION AND CURETTAGE /HYSTEROSCOPY with MYOSURE (N/A)  SURGEON:  Surgeon(s) and Role:    * Thurnell Lose, MD - Primary  PHYSICIAN ASSISTANT: None  ASSISTANTS: none   ANESTHESIA:   local and general  EBL:  Total I/O In: -  Out: 100 [Urine:100]  BLOOD ADMINISTERED:none  DRAINS: None   LOCAL MEDICATIONS USED:  LIDOCAINE  and Amount: 10 ml  SPECIMEN:  Source of Specimen:  endometrial polyps, endometrial currettings  DISPOSITION OF SPECIMEN:  PATHOLOGY  COUNTS:  YES  TOURNIQUET:  * No tourniquets in log *  DICTATION: .Other Dictation: Dictation Number [id number]  PLAN OF CARE: Discharge to home after PACU  PATIENT DISPOSITION:  PACU - hemodynamically stable.   Delay start of Pharmacological VTE agent (>24hrs) due to surgical blood loss or risk of bleeding: yes

## 2015-02-14 NOTE — Anesthesia Preprocedure Evaluation (Signed)
Anesthesia Evaluation  Patient identified by MRN, date of birth, ID band Patient awake    Reviewed: Allergy & Precautions, NPO status , Patient's Chart, lab work & pertinent test results  History of Anesthesia Complications (+) PONV and history of anesthetic complications  Airway Mallampati: II  TM Distance: >3 FB Neck ROM: Full    Dental  (+) Teeth Intact   Pulmonary former smoker,    breath sounds clear to auscultation       Cardiovascular hypertension, Pt. on medications  Rhythm:Regular Rate:Normal     Neuro/Psych  Headaches, PSYCHIATRIC DISORDERS Anxiety Depression    GI/Hepatic Neg liver ROS, GERD  Medicated,  Endo/Other  Hypothyroidism   Renal/GU negative Renal ROS  negative genitourinary   Musculoskeletal  (+) Arthritis ,   Abdominal   Peds negative pediatric ROS (+)  Hematology  (+) anemia ,   Anesthesia Other Findings   Reproductive/Obstetrics                             Lab Results  Component Value Date   WBC 12.5* 02/01/2015   HGB 15.8* 02/01/2015   HCT 46.6* 02/01/2015   MCV 91.6 02/01/2015   PLT 365 02/01/2015   Lab Results  Component Value Date   CREATININE 0.79 02/10/2015   BUN 21* 02/10/2015   NA 134* 02/10/2015   K 4.0 02/10/2015   CL 95* 02/10/2015   CO2 30 02/10/2015   No results found for: INR, PROTIME  01/2015 EKG: normal sinus rhythm.  Anesthesia Physical Anesthesia Plan  ASA: II  Anesthesia Plan: General   Post-op Pain Management:    Induction: Intravenous  Airway Management Planned: LMA  Additional Equipment:   Intra-op Plan:   Post-operative Plan: Extubation in OR  Informed Consent: I have reviewed the patients History and Physical, chart, labs and discussed the procedure including the risks, benefits and alternatives for the proposed anesthesia with the patient or authorized representative who has indicated his/her understanding and  acceptance.   Dental advisory given  Plan Discussed with: CRNA  Anesthesia Plan Comments:         Anesthesia Quick Evaluation

## 2015-02-14 NOTE — Discharge Instructions (Signed)

## 2015-02-14 NOTE — Interval H&P Note (Signed)
History and Physical Interval Note:  02/14/2015 12:07 PM  Debbie Cochran  has presented today for surgery, with the diagnosis of N95.0 PMB  The various methods of treatment have been discussed with the patient and family. After consideration of risks, benefits and other options for treatment, the patient has consented to  Procedure(s): DILATATION AND CURETTAGE /HYSTEROSCOPY (N/A) as a surgical intervention .  The patient's history has been reviewed, patient examined, no change in status, stable for surgery.  I have reviewed the patient's chart and labs.  Questions were answered to the patient's satisfaction.     Simona Huh, [name]

## 2015-02-14 NOTE — Anesthesia Procedure Notes (Signed)
Procedure Name: LMA Insertion Date/Time: 02/14/2015 12:21 PM Performed by: Royalti Schauf A Pre-anesthesia Checklist: Patient identified, Emergency Drugs available, Suction available and Patient being monitored Patient Re-evaluated:Patient Re-evaluated prior to inductionOxygen Delivery Method: Circle system utilized Preoxygenation: Pre-oxygenation with 100% oxygen Intubation Type: IV induction LMA: LMA inserted LMA Size: 4.0 Number of attempts: 1 Placement Confirmation: ETT inserted through vocal cords under direct vision,  breath sounds checked- equal and bilateral and positive ETCO2 Tube secured with: Tape

## 2015-02-15 ENCOUNTER — Telehealth: Payer: Self-pay | Admitting: *Deleted

## 2015-02-15 ENCOUNTER — Encounter (HOSPITAL_COMMUNITY): Payer: Self-pay | Admitting: Obstetrics and Gynecology

## 2015-02-15 NOTE — Telephone Encounter (Signed)
-----   Message from Skeet Latch, MD sent at 02/14/2015  4:36 PM EST ----- Frequent PVCs that likely account for her symptoms.  These are not dangerous and are occurring <1% of the time.  We can try to increase her verapamil to 320 mg daily to see if that helps.  Follow up in 1 month.

## 2015-02-15 NOTE — Telephone Encounter (Signed)
LEFT DETAILED MESSAGE ON SECURE VOICEMAIL LEFT MESSAGE TO CALL BACK   NEED APPOINTMENT IN  1 MONTH WITH DR [facility]

## 2015-02-18 MED ORDER — VERAPAMIL HCL ER 300 MG PO CP24: 300.0000 mg | ORAL_CAPSULE | Freq: Every day | ORAL | Status: DC

## 2015-02-18 NOTE — Telephone Encounter (Signed)
Skeet Latch, MD  Raiford Simmonds, RN           Lets increase to 300 mg.   Thanks,  Tiffany      Spoke to patient. Result given . Verbalized understanding APPOINTMENT  03/23/15 SCHEDULE E-SENT MEDICATION PHARMACY  300 MG 90 DAY SUPPLY  X 3

## 2015-03-23 ENCOUNTER — Ambulatory Visit: Payer: BC Managed Care – PPO | Admitting: Cardiovascular Disease

## 2015-08-22 ENCOUNTER — Other Ambulatory Visit: Payer: Self-pay | Admitting: Family Medicine

## 2015-08-22 DIAGNOSIS — Z1231 Encounter for screening mammogram for malignant neoplasm of breast: Secondary | ICD-10-CM

## 2015-09-06 ENCOUNTER — Ambulatory Visit
Admission: RE | Admit: 2015-09-06 | Discharge: 2015-09-06 | Disposition: A | Discharge status: Home / Self Care | Payer: BC Managed Care – PPO | Source: Ambulatory Visit | Attending: Family Medicine | Admitting: Family Medicine

## 2015-09-06 DIAGNOSIS — Z1231 Encounter for screening mammogram for malignant neoplasm of breast: Secondary | ICD-10-CM

## 2016-02-27 ENCOUNTER — Other Ambulatory Visit: Payer: Self-pay | Admitting: Cardiovascular Disease

## 2016-02-27 NOTE — Telephone Encounter (Signed)
Please review for refill. Thanks!  

## 2016-05-28 ENCOUNTER — Other Ambulatory Visit: Payer: Self-pay | Admitting: Cardiovascular Disease

## 2016-05-29 NOTE — Telephone Encounter (Signed)
Please review for refill. Thanks!  

## 2016-08-26 ENCOUNTER — Other Ambulatory Visit: Payer: Self-pay | Admitting: Cardiovascular Disease

## 2016-08-27 NOTE — Telephone Encounter (Signed)
Please review for refill, thanks ! 

## 2016-11-19 ENCOUNTER — Other Ambulatory Visit: Payer: Self-pay | Admitting: Family Medicine

## 2016-11-19 DIAGNOSIS — Z139 Encounter for screening, unspecified: Secondary | ICD-10-CM

## 2016-12-17 ENCOUNTER — Ambulatory Visit
Admission: RE | Admit: 2016-12-17 | Discharge: 2016-12-17 | Disposition: A | Discharge status: Home / Self Care | Payer: BC Managed Care – PPO | Source: Ambulatory Visit | Attending: Family Medicine | Admitting: Family Medicine

## 2016-12-17 DIAGNOSIS — Z139 Encounter for screening, unspecified: Secondary | ICD-10-CM

## 2017-06-06 ENCOUNTER — Other Ambulatory Visit (HOSPITAL_COMMUNITY)
Admission: RE | Admit: 2017-06-06 | Discharge: 2017-06-06 | Disposition: A | Discharge status: Home / Self Care | Payer: BC Managed Care – PPO | Source: Ambulatory Visit | Attending: Obstetrics and Gynecology | Admitting: Obstetrics and Gynecology

## 2017-06-06 ENCOUNTER — Other Ambulatory Visit: Payer: Self-pay | Admitting: Obstetrics and Gynecology

## 2017-06-06 DIAGNOSIS — Z01411 Encounter for gynecological examination (general) (routine) with abnormal findings: Secondary | ICD-10-CM | POA: Insufficient documentation

## 2017-06-12 LAB — CYTOLOGY - PAP
DIAGNOSIS: NEGATIVE
HPV (WINDOPATH): NOT DETECTED

## 2017-11-27 ENCOUNTER — Other Ambulatory Visit: Payer: Self-pay | Admitting: Family Medicine

## 2017-11-27 DIAGNOSIS — Z1231 Encounter for screening mammogram for malignant neoplasm of breast: Secondary | ICD-10-CM

## 2018-01-07 ENCOUNTER — Ambulatory Visit
Admission: RE | Admit: 2018-01-07 | Discharge: 2018-01-07 | Disposition: A | Discharge status: Home / Self Care | Payer: BC Managed Care – PPO | Source: Ambulatory Visit | Attending: Family Medicine | Admitting: Family Medicine

## 2018-01-07 DIAGNOSIS — Z1231 Encounter for screening mammogram for malignant neoplasm of breast: Secondary | ICD-10-CM

## 2019-01-27 ENCOUNTER — Other Ambulatory Visit: Payer: Self-pay | Admitting: Family Medicine

## 2019-01-27 DIAGNOSIS — Z1231 Encounter for screening mammogram for malignant neoplasm of breast: Secondary | ICD-10-CM

## 2019-01-30 ENCOUNTER — Ambulatory Visit
Admission: RE | Admit: 2019-01-30 | Discharge: 2019-01-30 | Disposition: A | Discharge status: Home / Self Care | Payer: BC Managed Care – PPO | Source: Ambulatory Visit | Attending: Family Medicine | Admitting: Family Medicine

## 2019-01-30 ENCOUNTER — Other Ambulatory Visit: Payer: Self-pay

## 2019-01-30 DIAGNOSIS — Z1231 Encounter for screening mammogram for malignant neoplasm of breast: Secondary | ICD-10-CM

## 2020-02-15 ENCOUNTER — Other Ambulatory Visit: Payer: Self-pay | Admitting: Family Medicine

## 2020-02-15 DIAGNOSIS — Z1231 Encounter for screening mammogram for malignant neoplasm of breast: Secondary | ICD-10-CM

## 2020-02-16 ENCOUNTER — Encounter: Payer: BC Managed Care – PPO | Attending: Family Medicine | Admitting: Dietician

## 2020-02-16 ENCOUNTER — Encounter: Payer: Self-pay | Admitting: Dietician

## 2020-02-16 ENCOUNTER — Other Ambulatory Visit: Payer: Self-pay

## 2020-02-16 DIAGNOSIS — E119 Type 2 diabetes mellitus without complications: Secondary | ICD-10-CM | POA: Diagnosis present

## 2020-02-16 NOTE — Patient Instructions (Signed)
Great job on the changes that you have made!  Continue to limit sugar.  Decisions start in the grocery store.  Find a way to be active.  Aim for 30 minutes most days.  Slowly increase.  Consistent meals.  Be sure you are having a small amount of carbohydrate with each meal (30 grams carbs per meal would be acceptable).  Listen to your body.  Before snacking, ask, "Am I hungry or eating for another reason?"   Mindfulness.

## 2020-02-16 NOTE — Progress Notes (Signed)
Diabetes Self-Management Education  Visit Type: First/Initial  Appt. Start Time: 1000 Appt. End Time: 1100  02/16/2020  [name], identified by name and date of birth, is a 65 y.o. female with a diagnosis of Diabetes: Type 2.   ASSESSMENT Patient is here today alone.   She reports trying Weight Watchers but does not like this.  She had NOOM a couple of years ago and preferred this.  She lost 20 lbs at that time and regained.  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 Labs noted to include:  A1C 7.2% 12/12/2019 increased from 6.7% 05/12/2019, Vitamin D 38 and vitamin B-12 527 (02/10/2019.  Patient stated that she has made some dietary changes since her last A1C. Medications:  Noted to include none for diabetes at this time.  Wt Readings from Last 3 Encounters:  02/16/20 261 lb (118.4 kg)  02/10/15 191 lb 2 oz (86.7 kg)  02/01/15 187 lb 4 oz (84.9 kg)   Patient lives alone.  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  She also Pet sits at times.  Pet sitting motivates her to exercise.  Diet intolerances:   Eating too much causes vomiting. Pasta and rice get stuck and vomits at times. Cake, candy and other concentrated sweets cause low blood sugar.  Weight 261 lb (118.4 kg), last menstrual period 01/05/2013. Body mass index is 43.43 kg/m.   Diabetes Self-Management Education - 02/16/20 1510      Visit Information   Visit Type First/Initial      Initial Visit   Diabetes Type Type 2    Are you currently following a meal plan? No    Are you taking your medications as prescribed? Not on Medications    Date Diagnosed 01/2019      Health Coping   How would you rate your overall health? Good      Psychosocial  Assessment   Patient Belief/Attitude about Diabetes Motivated to manage diabetes    Self-care barriers None    Self-management support Doctor's office    Other persons present Patient    Patient Concerns Nutrition/Meal planning;Glycemic Control;Healthy Lifestyle    Special Needs None    Preferred Learning Style No preference indicated    Learning Readiness Ready    How often do you need to have someone help you when you read instructions, pamphlets, or other written materials from your doctor or pharmacy? 1 - Never    What is the last grade level you completed in school? Graduate school      Pre-Education Assessment   Patient understands the diabetes disease and treatment process. Needs Instruction    Patient understands incorporating nutritional management into lifestyle. Needs Instruction    Patient undertands incorporating physical activity into lifestyle. Needs Instruction    Patient understands using medications safely. Needs Instruction    Patient understands monitoring blood glucose, interpreting and using results Needs Instruction    Patient understands prevention, detection, and treatment of acute complications. Needs Instruction    Patient understands prevention, detection, and treatment of chronic complications. Needs Instruction    Patient understands how to develop strategies to address psychosocial issues. Needs Instruction    Patient understands how to develop strategies to promote health/change  behavior. Needs Instruction      Complications   Last HgB A1C per patient/outside source 7.2 %   12/06/2019 increased from 6.7% 05/12/2019   How often do you check your blood sugar? 1-2 times/day    Fasting Blood glucose range (mg/dL) 130-179   130-150   Number of hypoglycemic episodes per month 0    Number of hyperglycemic episodes per week 0    Have you had a dilated eye exam in the past 12 months? No   appointment scheduled for Friday   Are you checking your feet? Yes    How  many days per week are you checking your feet? 7      Dietary Intake   Breakfast 2 boiled eggs OR frozen breafast sandwich    Snack (morning) none    Lunch chicken caesar salad OR chicken in a pita    Snack (afternoon) occasional cookie or nuts    Dinner leftovers   7   Snack (evening) 2 eggs, used to eat increased sweets and other snacks.  Wakes very hungry at night at times.    Beverage(s) water, hot green tea, diet soda or diet tea      Exercise   Exercise Type ADL's      Patient Education   Previous Diabetes Education No    Disease state  Definition of diabetes, type 1 and 2, and the diagnosis of diabetes    Nutrition management  Role of diet in the treatment of diabetes and the relationship between the three main macronutrients and blood glucose level;Food label reading, portion sizes and measuring food.;Meal options for control of blood glucose level and chronic complications.    Physical activity and exercise  Role of exercise on diabetes management, blood pressure control and cardiac health.    Medications Reviewed patients medication for diabetes, action, purpose, timing of dose and side effects.    Monitoring Identified appropriate SMBG and/or A1C goals.;Taught/discussed recording of test results and interpretation of SMBG.;Daily foot exams;Yearly dilated eye exam    Acute complications Taught treatment of hypoglycemia - the 15 rule.;Discussed and identified patients' treatment of hyperglycemia.    Chronic complications Relationship between chronic complications and blood glucose control;Retinopathy and reason for yearly dilated eye exams    Psychosocial adjustment Worked with patient to identify barriers to care and solutions;Role of stress on diabetes;Identified and addressed patients feelings and concerns about diabetes      Individualized Goals (developed by patient)   Nutrition Follow meal plan discussed    Physical Activity Exercise 5-7 days per week    Monitoring  test my  blood glucose as discussed    Reducing Risk examine blood glucose patterns;increase portions of healthy fats;do foot checks daily      Post-Education Assessment   Patient understands the diabetes disease and treatment process. Demonstrates understanding / competency    Patient understands incorporating nutritional management into lifestyle. Needs Review    Patient undertands incorporating physical activity into lifestyle. Needs Review    Patient understands using medications safely. Demonstrates understanding / competency    Patient understands monitoring blood glucose, interpreting and using results Demonstrates understanding / competency    Patient understands prevention, detection, and treatment of acute complications. Demonstrates understanding / competency    Patient understands prevention, detection, and treatment of chronic complications. Demonstrates understanding / competency    Patient understands how to develop strategies to address psychosocial issues. Demonstrates understanding / competency    Patient understands how to develop strategies to promote  health/change behavior. Needs Review      Outcomes   Expected Outcomes Demonstrated interest in learning. Expect positive outcomes    Future DMSE 4-6 wks    Program Status Not Completed           Individualized Plan for Diabetes Self-Management Training:   Learning Objective:  Patient will have a greater understanding of diabetes self-management. Patient education plan is to attend individual and/or group sessions per assessed needs and concerns.   Plan:   Patient Instructions  Doristine Devoid job on the changes that you have made!  Continue to limit sugar.  Decisions start in the grocery store.  Find a way to be active.  Aim for 30 minutes most days.  Slowly increase.  Consistent meals.  Be sure you are having a small amount of carbohydrate with each meal (30 grams carbs per meal would be acceptable).  Listen to your  body.  Before snacking, ask, "Am I hungry or eating for another reason?"   Mindfulness. Discussed the importance of spreading carbohydrates throughout the day to avoid increased night eating of carbohydrates.  Expected Outcomes:  Demonstrated interest in learning. Expect positive outcomes  Education material provided: ADA - How to Thrive: A Guide for Your Journey with Diabetes, Food label handouts, A1C conversion sheet, Meal plan card, My Plate, Snack sheet, Support group flyer, Carbohydrate counting sheet, No sodium seasonings and Diabetes Resources; types of fat; dining tips with diabets  If problems or questions, patient to contact team via:  Phone  Future DSME appointment: 4-6 wks

## 2020-02-23 ENCOUNTER — Ambulatory Visit: Payer: BC Managed Care – PPO

## 2020-03-01 ENCOUNTER — Ambulatory Visit: Payer: BC Managed Care – PPO

## 2020-03-28 ENCOUNTER — Encounter: Payer: BC Managed Care – PPO | Attending: Family Medicine | Admitting: Dietician

## 2020-03-28 DIAGNOSIS — E119 Type 2 diabetes mellitus without complications: Secondary | ICD-10-CM | POA: Insufficient documentation

## 2020-03-30 ENCOUNTER — Other Ambulatory Visit: Payer: Self-pay

## 2020-03-30 ENCOUNTER — Ambulatory Visit
Admission: RE | Admit: 2020-03-30 | Discharge: 2020-03-30 | Disposition: A | Discharge status: Home / Self Care | Payer: BC Managed Care – PPO | Source: Ambulatory Visit | Attending: Family Medicine | Admitting: Family Medicine

## 2020-03-30 DIAGNOSIS — Z1231 Encounter for screening mammogram for malignant neoplasm of breast: Secondary | ICD-10-CM

## 2020-04-18 ENCOUNTER — Ambulatory Visit: Payer: BC Managed Care – PPO | Admitting: Dietician

## 2020-06-07 ENCOUNTER — Ambulatory Visit: Payer: BC Managed Care – PPO | Admitting: Dietician

## 2020-10-31 ENCOUNTER — Encounter (INDEPENDENT_AMBULATORY_CARE_PROVIDER_SITE_OTHER): Payer: Self-pay | Admitting: Ophthalmology

## 2020-10-31 ENCOUNTER — Other Ambulatory Visit: Payer: Self-pay

## 2020-10-31 ENCOUNTER — Ambulatory Visit (INDEPENDENT_AMBULATORY_CARE_PROVIDER_SITE_OTHER): Payer: BC Managed Care – PPO | Admitting: Ophthalmology

## 2020-10-31 DIAGNOSIS — E119 Type 2 diabetes mellitus without complications: Secondary | ICD-10-CM | POA: Diagnosis not present

## 2020-10-31 DIAGNOSIS — H25813 Combined forms of age-related cataract, bilateral: Secondary | ICD-10-CM

## 2020-10-31 DIAGNOSIS — H3581 Retinal edema: Secondary | ICD-10-CM

## 2020-10-31 DIAGNOSIS — I10 Essential (primary) hypertension: Secondary | ICD-10-CM

## 2020-10-31 DIAGNOSIS — H35033 Hypertensive retinopathy, bilateral: Secondary | ICD-10-CM

## 2020-10-31 DIAGNOSIS — H43822 Vitreomacular adhesion, left eye: Secondary | ICD-10-CM

## 2020-10-31 DIAGNOSIS — H35342 Macular cyst, hole, or pseudohole, left eye: Secondary | ICD-10-CM | POA: Diagnosis not present

## 2020-10-31 DIAGNOSIS — H35372 Puckering of macula, left eye: Secondary | ICD-10-CM

## 2020-10-31 NOTE — Progress Notes (Signed)
[facility] Note  10/31/2020     CHIEF COMPLAINT Patient presents for Retina Evaluation   HISTORY OF PRESENT ILLNESS: Debbie Cochran is a 65 y.o. female who presents to the clinic today for:   HPI     Retina Evaluation   In left eye.  This started weeks ago.  Duration of weeks.  Context:  distance vision and near vision.  I, the attending physician,  performed the HPI with the patient and updated documentation appropriately.        Comments   Pt states she noticed a few weeks ago that her left eye had become very blurry.  Pt states vision OS looks like a cloud now.  Pt denies eye pain or discomfort.  Pt denies new or worsening floaters or fol OU.      Last edited by Bernarda Caffey, MD on 10/31/2020 12:55 PM.    Pt is here on the referral of Dr. Eulas Post for concern of VMT OS, pt states she saw him on Friday bc she feels like over the past 2-3 weeks her vision has decreased centrally, she denies any distortion, but feels like when she is reading certain spots in her vision look "smudged", pts last A1c was 7.2 on 11.29.21  Referring physician: Lonia Skinner, MD [place],  [place] [id number]  HISTORICAL INFORMATION:   Selected notes from the MEDICAL RECORD NUMBER Referred by Dr. Eulas Post for VMT OS LEE:  Ocular Hx- PMH-    CURRENT MEDICATIONS: No current outpatient medications on file. (Ophthalmic Drugs)   No current facility-administered medications for this visit. (Ophthalmic Drugs)   Current Outpatient Medications (Other)  Medication Sig   acetaminophen (TYLENOL) 650 MG CR tablet Take 1,300 mg by mouth daily as needed for pain.   ALPRAZolam (XANAX) 0.25 MG tablet Take 0.25 mg by mouth 3 (three) times daily as needed for anxiety.    b complex vitamins tablet Take 1 tablet by mouth daily.   busPIRone (BUSPAR) 5 MG tablet Take 1 tablet by mouth 2 (two) times daily. Take 1 tab bid   Calcium Carb-Cholecalciferol (CALCIUM  600 + D PO) Take 1 tablet by mouth daily.    cetirizine-pseudoephedrine (ZYRTEC-D) 5-120 MG per tablet Take 1 tablet by mouth daily as needed for allergies.   cholecalciferol (VITAMIN D) 1000 UNITS tablet Take 1,000 Units by mouth daily.   DULoxetine (CYMBALTA) 60 MG capsule Take 60 mg by mouth daily.   fluticasone (VERAMYST) 27.5 MCG/SPRAY nasal spray Place 2 sprays into the nose daily.   folic acid (FOLVITE) 440 MCG tablet Take 400 mcg by mouth daily.   glucosamine-chondroitin 500-400 MG tablet Take 2 tablets by mouth daily.   ibuprofen (ADVIL,MOTRIN) 600 MG tablet Take 1 tablet (600 mg total) by mouth every 6 (six) hours as needed.   levothyroxine (SYNTHROID, LEVOTHROID) 75 MCG tablet Take 75 mcg by mouth daily before breakfast.   lovastatin (MEVACOR) 40 MG tablet Take 40 mg by mouth daily.   magnesium gluconate (MAGONATE) 500 MG tablet Take 500 mg by mouth daily.   Multiple Vitamin (MULTIVITAMIN) tablet Take 1 tablet by mouth daily.   omeprazole (PRILOSEC) 20 MG capsule Take 20 mg by mouth daily.   trandolapril (MAVIK) 4 MG tablet Take 0.5 tablets by mouth daily.   triamterene-hydrochlorothiazide (MAXZIDE) 75-50 MG per tablet Take 0.5 tablets by mouth daily.    Verapamil HCl CR 300 MG CP24 Take 1 capsule (300 mg total)  by mouth daily. Please schedule appointment for refills   vitamin C (ASCORBIC ACID) 500 MG tablet Take 500 mg by mouth daily.   No current facility-administered medications for this visit. (Other)   REVIEW OF SYSTEMS: ROS   Positive for: Eyes Negative for: Constitutional, Gastrointestinal, Neurological, Skin, Genitourinary, Musculoskeletal, HENT, Endocrine, Cardiovascular, Respiratory, Psychiatric, Allergic/Imm, Heme/Lymph Last edited by Doneen Poisson on 10/31/2020  8:29 AM.    ALLERGIES Allergies  Allergen Reactions   Hyoscyamine Sulfate Other (See Comments)    Other Reaction: Other reaction   Penicillins Hives and Swelling    Has patient had a PCN reaction  causing immediate rash, facial/tongue/throat swelling, SOB or lightheadedness with hypotension: Yes Has patient had a PCN reaction causing severe rash involving mucus membranes or skin necrosis: No Has patient had a PCN reaction that required hospitalization No Has patient had a PCN reaction occurring within the last 10 years: No If all of the above answers are "NO", then may proceed with Cephalosporin use.    PAST MEDICAL HISTORY Past Medical History:  Diagnosis Date   Allergy    Anemia    history of   Anxiety    Arthritis    Depression    Diabetes mellitus without complication ([place])    Essential hypertension 02/01/2015   GERD (gastroesophageal reflux disease)    Headache    history of   Hyperlipidemia    Hypertension    Hypothyroidism    Palpitations 02/01/2015   Polycythemia, secondary 09/10/2013   PONV (postoperative nausea and vomiting)    Thyroid disease    Past Surgical History:  Procedure Laterality Date   BUNIONECTOMY Right    COLONOSCOPY     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   HYSTEROSCOPY WITH D & C N/A 02/14/2015   Procedure: DILATATION AND CURETTAGE /HYSTEROSCOPY with MYOSURE;  Surgeon: Thurnell Lose, MD;  Location: [facility];  Service: Gynecology;  Laterality: N/A;   TONSILLECTOMY     FAMILY HISTORY Family History  Problem Relation Age of Onset   Dementia Mother    Breast cancer Mother 82   Dementia Father    Arthritis Father    Breast cancer Sister 62   Breast cancer Maternal Aunt 68   SOCIAL HISTORY Social History   Tobacco Use   Smoking status: Former    Packs/day: 0.01    Years: 35.00    Pack years: 0.35    Types: Cigarettes    Quit date: 06/15/2013    Years since quitting: 7.3   Smokeless tobacco: Never  Substance Use Topics   Alcohol use: Yes    Comment: once in a while not daily   Drug use: No       OPHTHALMIC EXAM: Base Eye Exam     Visual Acuity (Snellen - Linear)       Right Left   Dist cc 20/20 20/40 +1   Dist ph cc  NI     Correction: Glasses         Tonometry (Tonopen, 8:40 AM)       Right Left   Pressure 20 21         Pupils       Dark Light Shape React APD   Right 3 2 Round Brisk 0   Left 3 2 Round Brisk 0         Visual Fields       Left Right    Full Full  Extraocular Movement       Right Left    Full Full         Neuro/Psych     Oriented x3: Yes   Mood/Affect: Normal         Dilation     Both eyes: 1.0% Mydriacyl, 2.5% Phenylephrine @ 8:40 AM           Slit Lamp and Fundus Exam     Slit Lamp Exam       Right Left   Lids/Lashes Dermatochalasis - upper lid, mild MGD Dermatochalasis - upper lid, mild MGD   Conjunctiva/Sclera White and quiet White and quiet   Cornea mild arcus, trace PEE mild arcus, trace PEE   Anterior Chamber Deep and quiet Deep and quiet   Iris Round and dilated Round and dilated   Lens 2+ Cortical cataract, 2+ Nuclear sclerosis 2+ Cortical cataract, 2+ Nuclear sclerosis   Vitreous Vitreous syneresis Vitreous syneresis         Fundus Exam       Right Left   Disc Pink and Sharp, mild PPA Pink and Sharp, mild PPA   C/D Ratio 0.6 0.5   Macula Flat, Good foveal reflex, RPE mottling, No heme or edema Flat, Blunted foveal reflex, ERM with central cyst, no heme   Vessels mild attenuation mild tortuousity, mild Copper wiring   Periphery Attached, cluster of DBH IN periphery    Attached, VR tuft at 0430, No heme            Refraction     Wearing Rx       Sphere Cylinder Axis Add   Right +0.00 +0.25 180 +2.50   Left -0.25 +0.25 180 +2.50         Manifest Refraction       Sphere Cylinder Dist VA   Right      Left -0.75 Sphere NI            IMAGING AND PROCEDURES  Imaging and Procedures for 10/31/2020  OCT, Retina - OU - Both Eyes       Right Eye Quality was good. Central Foveal Thickness: 312. Progression has no prior data. Findings include normal foveal contour, no IRF, no SRF, vitreomacular adhesion .    Left Eye Quality was good. Central Foveal Thickness: 401. Progression has no prior data. Findings include abnormal foveal contour, intraretinal fluid, no SRF, vitreous traction, epiretinal membrane, macular hole.   Notes *Images captured and stored on drive  Diagnosis / Impression:  OD: NFP, no IRF/SRF OS: VMT with central macular cyst; mild ERM and pucker  Clinical management:  See below  Abbreviations: NFP - Normal foveal profile. CME - cystoid macular edema. PED - pigment epithelial detachment. IRF - intraretinal fluid. SRF - subretinal fluid. EZ - ellipsoid zone. ERM - epiretinal membrane. ORA - outer retinal atrophy. ORT - outer retinal tubulation. SRHM - subretinal hyper-reflective material. IRHM - intraretinal hyper-reflective material            ASSESSMENT/PLAN:    ICD-10-CM   1. Vitreomacular traction syndrome, left  H43.822     2. Epiretinal membrane (ERM) of left eye  H35.372     3. Macular cyst, hole, or pseudohole, left eye  H35.342     4. Retinal edema  H35.81 OCT, Retina - OU - Both Eyes    5. Diabetes mellitus type 2 without retinopathy ([place])  E11.9     6. Essential hypertension  I10     7.  Hypertensive retinopathy of both eyes  H35.033     8. Combined forms of age-related cataract of both eyes  H25.813      1-4. 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 Diabetes mellitus, type 2 without retinopathy - The incidence, risk factors for progression, natural history and treatment options for diabetic retinopathy  were discussed with patient.   - The need for close monitoring of blood glucose, blood pressure, and serum lipids, avoiding cigarette or any type of  tobacco, and the need for long term follow up was also discussed with patient. - f/u in 1 year, sooner prn  6,7. Hypertensive retinopathy OU - discussed importance of tight BP control - monitor  8. Mixed Cataract OU - The symptoms of cataract, surgical options, and treatments and risks were discussed with patient. - discussed diagnosis and progression - not yet visually significant - monitor for now  Ophthalmic Meds Ordered this visit:  No orders of the defined types were placed in this encounter.    Return in about 6 weeks (around 12/12/2020) for f/u VMT OS, DFE, OCT.  There are no Patient Instructions on file for this visit.   Explained the diagnoses, plan, and follow up with the patient and they expressed understanding.  Patient expressed understanding of the importance of proper follow up care.   This document serves as a record of services personally performed by Gardiner Sleeper, MD, PhD. It was created on their behalf by Leeann Must, [place], an ophthalmic technician. The creation of this record is the provider's dictation and/or activities during the visit.    Electronically signed by: Leeann Must, COA @TODAY @ 1:03 PM  Gardiner Sleeper, M.D., Ph.D. Diseases & Surgery of the Retina and Vitreous [facility]  I have reviewed the above documentation for accuracy and completeness, and I agree with the above. Gardiner Sleeper, M.D., Ph.D. 10/31/20 1:03 PM   Abbreviations: M myopia (nearsighted); A astigmatism; H hyperopia (farsighted); P presbyopia; Mrx spectacle prescription;  CTL contact lenses; OD right eye; OS left eye; OU both eyes  XT exotropia; ET esotropia; PEK punctate epithelial keratitis; PEE punctate epithelial erosions; DES dry eye syndrome; MGD meibomian gland dysfunction; ATs artificial tears; PFAT's preservative free artificial tears; Tat [place] nuclear sclerotic cataract; PSC posterior subcapsular cataract; ERM epi-retinal membrane; PVD posterior  vitreous detachment; RD retinal detachment; DM diabetes mellitus; DR diabetic retinopathy; NPDR non-proliferative diabetic retinopathy; PDR proliferative diabetic retinopathy; CSME clinically significant macular edema; DME diabetic macular edema; dbh dot blot hemorrhages; CWS cotton wool spot; POAG primary open angle glaucoma; C/D cup-to-disc ratio; HVF humphrey visual field; GVF goldmann visual field; OCT optical coherence tomography; IOP intraocular pressure; BRVO Branch retinal vein occlusion; CRVO central retinal vein occlusion; CRAO central retinal artery occlusion; BRAO branch retinal artery occlusion; RT retinal tear; SB scleral buckle; PPV pars plana vitrectomy; VH Vitreous hemorrhage; PRP panretinal laser photocoagulation; IVK intravitreal kenalog; VMT vitreomacular traction; MH Macular hole;  NVD neovascularization of the disc; NVE neovascularization elsewhere; AREDS age related eye disease study; ARMD age related macular degeneration; POAG primary open angle glaucoma; EBMD epithelial/anterior basement membrane dystrophy; ACIOL anterior chamber intraocular lens;  IOL intraocular lens; PCIOL posterior chamber intraocular lens; Phaco/IOL phacoemulsification with intraocular lens placement; [place] photorefractive keratectomy; LASIK laser assisted in situ keratomileusis; HTN hypertension; DM diabetes mellitus; COPD chronic obstructive pulmonary disease

## 2020-12-06 NOTE — Progress Notes (Signed)
[facility] Note  12/12/2020     CHIEF COMPLAINT Patient presents for Retina Follow Up   HISTORY OF PRESENT ILLNESS: Debbie Cochran is a 65 y.o. female who presents to the clinic today for:   HPI     Retina Follow Up   Patient presents with  Other.  In left eye.  This started weeks ago.  Severity is moderate.  Duration of 6 weeks.  Since onset it is stable.  I, the attending physician,  performed the HPI with the patient and updated documentation appropriately.        Comments   65 y/o female pt here for 6 wk f/u for VMT+ERM w/mac pucker and cyst OS.  No change in [place] OU, but has been noticing a constant floater temporally OS for the past few wks and has also been noticing new FOL temporally OS in dark areas.  No gtts.  BS and A1C unknown.      Last edited by Bernarda Caffey, MD on 12/12/2020  4:36 PM.     Pt states she has a new floater OS and has been seeing new fol temporally since she was here last  Referring physician: Lonia Skinner, MD [place],  [place] [id number]  HISTORICAL INFORMATION:   Selected notes from the MEDICAL RECORD NUMBER Referred by Dr. Eulas Post for VMT OS LEE:  Ocular Hx- PMH-    CURRENT MEDICATIONS: No current outpatient medications on file. (Ophthalmic Drugs)   No current facility-administered medications for this visit. (Ophthalmic Drugs)   Current Outpatient Medications (Other)  Medication Sig   acetaminophen (TYLENOL) 650 MG CR tablet Take 1,300 mg by mouth daily as needed for pain.   acetaminophen (TYLENOL) 650 MG CR tablet Take by mouth.   ALPRAZolam (XANAX) 0.25 MG tablet Take 0.25 mg by mouth 3 (three) times daily as needed for anxiety.    b complex vitamins tablet Take 1 tablet by mouth daily.   Blood Glucose Monitoring Suppl (ACCU-CHEK AVIVA PLUS) w/Device KIT See admin instructions.   busPIRone (BUSPAR) 10 MG tablet 1 tablet   busPIRone (BUSPAR) 5 MG tablet Take 1 tablet by mouth  2 (two) times daily. Take 1 tab bid   Calcium Carb-Cholecalciferol (CALCIUM 600 + D PO) Take 1 tablet by mouth daily.    cetirizine-pseudoephedrine (ZYRTEC-D) 5-120 MG per tablet Take 1 tablet by mouth daily as needed for allergies.   cholecalciferol (VITAMIN D) 1000 UNITS tablet Take 1,000 Units by mouth daily.   DULoxetine (CYMBALTA) 30 MG capsule 1 capsule   DULoxetine (CYMBALTA) 60 MG capsule Take 60 mg by mouth daily.   fluticasone (VERAMYST) 27.5 MCG/SPRAY nasal spray Place into the nose.   folic acid (FOLVITE) 195 MCG tablet Take 400 mcg by mouth daily.   glucosamine-chondroitin 500-400 MG tablet Take 2 tablets by mouth daily.   glucose blood (ACCU-CHEK AVIVA PLUS) test strip See admin instructions.   ibuprofen (ADVIL,MOTRIN) 600 MG tablet Take 1 tablet (600 mg total) by mouth every 6 (six) hours as needed.   levothyroxine (SYNTHROID) 75 MCG tablet Take 1 tablet by mouth daily.   levothyroxine (SYNTHROID, LEVOTHROID) 75 MCG tablet Take 75 mcg by mouth daily before breakfast.   lovastatin (MEVACOR) 40 MG tablet Take 40 mg by mouth daily.   lovastatin (MEVACOR) 40 MG tablet Take by mouth.   magnesium gluconate (MAGONATE) 500 MG tablet Take 500 mg by mouth daily.   Microlet Lancets MISC See  admin instructions.   Multiple Vitamin (MULTIVITAMIN) tablet Take 1 tablet by mouth daily.   omeprazole (PRILOSEC) 20 MG capsule Take 20 mg by mouth daily.   omeprazole (PRILOSEC) 20 MG capsule Take 1 tablet by mouth daily.   Potassium 99 MG TABS 1 tablet   rosuvastatin (CRESTOR) 10 MG tablet Take 1 tablet by mouth daily.   sodium fluoride (PREVIDENT 5000 PLUS) 1.1 % CREA dental cream BRUSH ONCE A DAY BEFORE BEDTIME. NO FOOD OR FLUIDS FOR 30 MINUTES   trandolapril (MAVIK) 4 MG tablet Take 0.5 tablets by mouth daily.   triamterene-hydrochlorothiazide (MAXZIDE) 75-50 MG per tablet Take 0.5 tablets by mouth daily.    Verapamil HCl CR 300 MG CP24 Take 1 capsule (300 mg total) by mouth daily. Please  schedule appointment for refills   vitamin C (ASCORBIC ACID) 500 MG tablet Take 500 mg by mouth daily.   Vitamin D, Cholecalciferol, 25 MCG (1000 UT) CAPS 1 capsule   fluticasone (VERAMYST) 27.5 MCG/SPRAY nasal spray Place 2 sprays into the nose daily. (Patient not taking: Reported on 12/12/2020)   magnesium gluconate (MAGONATE) 500 MG tablet Take by mouth. (Patient not taking: Reported on 12/12/2020)   No current facility-administered medications for this visit. (Other)   REVIEW OF SYSTEMS: ROS   Positive for: Gastrointestinal, Neurological, Endocrine, Cardiovascular, Eyes Negative for: Constitutional, Skin, Genitourinary, Musculoskeletal, HENT, Respiratory, Psychiatric, Allergic/Imm, Heme/Lymph Last edited by Matthew Folks, COA on 12/12/2020  8:17 AM.     ALLERGIES Allergies  Allergen Reactions   Hyoscyamine Sulfate Other (See Comments)    Other Reaction: Other reaction   Penicillins Hives and Swelling    Has patient had a PCN reaction causing immediate rash, facial/tongue/throat swelling, SOB or lightheadedness with hypotension: Yes Has patient had a PCN reaction causing severe rash involving mucus membranes or skin necrosis: No Has patient had a PCN reaction that required hospitalization No Has patient had a PCN reaction occurring within the last 10 years: No If all of the above answers are "NO", then may proceed with Cephalosporin use.    PAST MEDICAL HISTORY Past Medical History:  Diagnosis Date   Allergy    Anemia    history of   Anxiety    Arthritis    Cataract    Depression    Diabetes mellitus without complication ([place])    Essential hypertension 02/01/2015   GERD (gastroesophageal reflux disease)    Headache    history of   Hyperlipidemia    Hypertension    Hypertensive retinopathy    Hypothyroidism    Palpitations 02/01/2015   Polycythemia, secondary 09/10/2013   PONV (postoperative nausea and vomiting)    Thyroid disease    Past Surgical History:   Procedure Laterality Date   BUNIONECTOMY Right    COLONOSCOPY     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   HYSTEROSCOPY WITH D & C N/A 02/14/2015   Procedure: DILATATION AND CURETTAGE /HYSTEROSCOPY with MYOSURE;  Surgeon: Thurnell Lose, MD;  Location: [facility];  Service: Gynecology;  Laterality: N/A;   TONSILLECTOMY     FAMILY HISTORY Family History  Problem Relation Age of Onset   Dementia Mother    Breast cancer Mother 3   Dementia Father    Arthritis Father    Breast cancer Sister 36   Breast cancer Maternal Aunt 68   SOCIAL HISTORY Social History   Tobacco Use   Smoking status: Former    Packs/day: 0.01    Years: 35.00  Pack years: 0.35    Types: Cigarettes    Quit date: 06/15/2013    Years since quitting: 7.4   Smokeless tobacco: Never  Substance Use Topics   Alcohol use: Yes    Comment: once in a while not daily   Drug use: No       OPHTHALMIC EXAM: Base Eye Exam     Visual Acuity (Snellen - Linear)       Right Left   Dist cc 20/20 -2 20/20 -2    Correction: Glasses         Tonometry (Tonopen, 8:22 AM)       Right Left   Pressure 15 19         Pupils       Dark Light Shape React APD   Right 3 2 Round Brisk None   Left 3 2 Round Brisk None         Visual Fields (Counting fingers)       Left Right    Full Full         Extraocular Movement       Right Left    Full, Ortho Full, Ortho         Neuro/Psych     Oriented x3: Yes   Mood/Affect: Normal         Dilation     Both eyes: 1.0% Mydriacyl, 2.5% Phenylephrine @ 8:22 AM           Slit Lamp and Fundus Exam     Slit Lamp Exam       Right Left   Lids/Lashes Dermatochalasis - upper lid, mild MGD Dermatochalasis - upper lid, mild MGD   Conjunctiva/Sclera White and quiet White and quiet   Cornea mild arcus, trace PEE mild arcus, trace PEE   Anterior Chamber Deep and quiet Deep and quiet   Iris Round and dilated Round and dilated   Lens 2+ Cortical cataract, 2+  Nuclear sclerosis 2+ Cortical cataract, 2+ Nuclear sclerosis   Anterior Vitreous Vitreous syneresis Vitreous syneresis, no pigment, Posterior vitreous detachment, vitreous condensations         Fundus Exam       Right Left   Disc Pink and Sharp, mild PPA Pink and Sharp, mild PPA   C/D Ratio 0.6 0.5   Macula Flat, Good foveal reflex, RPE mottling, No heme or edema Flat, Blunted foveal reflex, ERM with central cyst, no heme   Vessels mild attenuation, mild tortuousity, Copper wiring attenuated, Tortuous, mild Copper wiring   Periphery Attached, cluster of DBH IN periphery -- improved, mild paving stone degeneration inferiorly, No RT/RD Attached, VR tuft at 0430, No heme, mild reticular degeneration, No RT/RD on 360 scleral depression            IMAGING AND PROCEDURES  Imaging and Procedures for 12/12/2020  OCT, Retina - OU - Both Eyes       Right Eye Quality was good. Central Foveal Thickness: 311. Progression has been stable. Findings include normal foveal contour, no IRF, no SRF, vitreomacular adhesion .   Left Eye Quality was good. Central Foveal Thickness: 401. Progression has improved. Findings include abnormal foveal contour, intraretinal fluid, no SRF, epiretinal membrane (Interval release of VMT to apparent full PVD, mild residual central cyst, stable, mild ERM superior macula).   Notes *Images captured and stored on drive  Diagnosis / Impression:  OD: NFP, no IRF/SRF OS: Interval release of VMT to apparent full PVD, mild residual central cyst; stable,  mild ERM superior macula  Clinical management:  See below  Abbreviations: NFP - Normal foveal profile. CME - cystoid macular edema. PED - pigment epithelial detachment. IRF - intraretinal fluid. SRF - subretinal fluid. EZ - ellipsoid zone. ERM - epiretinal membrane. ORA - outer retinal atrophy. ORT - outer retinal tubulation. SRHM - subretinal hyper-reflective material. IRHM - intraretinal hyper-reflective  material            ASSESSMENT/PLAN:    ICD-10-CM   1. Vitreomacular traction syndrome, left  H43.822     2. Epiretinal membrane (ERM) of left eye  H35.372     3. Macular cyst, hole, or pseudohole, left eye  H35.342     4. Retinal edema  H35.81 OCT, Retina - OU - Both Eyes    5. Posterior vitreous detachment of left eye  H43.812     6. Diabetes mellitus type 2 without retinopathy ([place])  E11.9     7. Essential hypertension  I10     8. Hypertensive retinopathy of both eyes  H35.033     9. Combined forms of age-related cataract of both eyes  H25.813      1-4. 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 PVD / vitreous syneresis OS  - onset of symptomatic floaters and intermittent flashes 3-4 wks ago (~early Nov 2022)  - Discussed findings and prognosis  - No RT or RD on 360 scleral depressed exam  - Reviewed s/s of RT/RD  - Strict return precautions for any such RT/RD signs/symptoms  - f/u in 6 wks -- DFE/OCT   6. Diabetes mellitus, type 2 without retinopathy - The incidence, risk factors for progression, natural history and treatment options for diabetic retinopathy  were discussed with patient.   - The need for close monitoring of blood glucose, blood pressure, and serum lipids, avoiding cigarette or any type of tobacco, and the need for long term follow up was also discussed with patient. - f/u in 1 year, sooner prn  7,8. Hypertensive retinopathy OU - discussed importance of tight BP control - monitor  9. Mixed Cataract OU - The symptoms of cataract, surgical options, and treatments and risks were discussed  with patient. - discussed diagnosis and progression - Under the expert management of Dr. Eulas Post - monitor for now  Ophthalmic Meds Ordered this visit:  No orders of the defined types were placed in this encounter.    Return in about 6 weeks (around 01/23/2021) for f/u VMT + ERM OS, DFE, OCT.  There are no Patient Instructions on file for this visit.   Explained the diagnoses, plan, and follow up with the patient and they expressed understanding.  Patient expressed understanding of the importance of proper follow up care.   This document serves as a record of services personally performed by Gardiner Sleeper, MD, PhD. It was created on their behalf by Roselee Nova, COMT. The creation of this record is the provider's dictation and/or activities during the visit.  Electronically signed by: Roselee Nova, COMT 12/12/20 4:41 PM  This document serves as a record of services personally performed by Gardiner Sleeper,  MD, PhD. It was created on their behalf by San Jetty. Owens Shark, OA an ophthalmic technician. The creation of this record is the provider's dictation and/or activities during the visit.    Electronically signed by: San Jetty. Owens Shark, [place] 11.28.2022 4:41 PM   Gardiner Sleeper, M.D., Ph.D. Diseases & Surgery of the Retina and Vitreous [facility]  I have reviewed the above documentation for accuracy and completeness, and I agree with the above. Gardiner Sleeper, M.D., Ph.D. 12/12/20 4:41 PM   Abbreviations: M myopia (nearsighted); A astigmatism; H hyperopia (farsighted); P presbyopia; Mrx spectacle prescription;  CTL contact lenses; OD right eye; OS left eye; OU both eyes  XT exotropia; ET esotropia; PEK punctate epithelial keratitis; PEE punctate epithelial erosions; DES dry eye syndrome; MGD meibomian gland dysfunction; ATs artificial tears; PFAT's preservative free artificial tears; [place] nuclear sclerotic cataract; PSC posterior subcapsular cataract; ERM epi-retinal  membrane; PVD posterior vitreous detachment; RD retinal detachment; DM diabetes mellitus; DR diabetic retinopathy; NPDR non-proliferative diabetic retinopathy; PDR proliferative diabetic retinopathy; CSME clinically significant macular edema; DME diabetic macular edema; dbh dot blot hemorrhages; CWS cotton wool spot; POAG primary open angle glaucoma; C/D cup-to-disc ratio; HVF humphrey visual field; GVF goldmann visual field; OCT optical coherence tomography; IOP intraocular pressure; BRVO Branch retinal vein occlusion; CRVO central retinal vein occlusion; CRAO central retinal artery occlusion; BRAO branch retinal artery occlusion; RT retinal tear; SB scleral buckle; PPV pars plana vitrectomy; VH Vitreous hemorrhage; PRP panretinal laser photocoagulation; IVK intravitreal kenalog; VMT vitreomacular traction; MH Macular hole;  NVD neovascularization of the disc; NVE neovascularization elsewhere; AREDS age related eye disease study; ARMD age related macular degeneration; POAG primary open angle glaucoma; EBMD epithelial/anterior basement membrane dystrophy; ACIOL anterior chamber intraocular lens; IOL intraocular lens; PCIOL posterior chamber intraocular lens; Phaco/IOL phacoemulsification with intraocular lens placement; [place] photorefractive keratectomy; LASIK laser assisted in situ keratomileusis; HTN hypertension; DM diabetes mellitus; COPD chronic obstructive pulmonary disease

## 2020-12-12 ENCOUNTER — Other Ambulatory Visit: Payer: Self-pay

## 2020-12-12 ENCOUNTER — Encounter (INDEPENDENT_AMBULATORY_CARE_PROVIDER_SITE_OTHER): Payer: Self-pay | Admitting: Ophthalmology

## 2020-12-12 ENCOUNTER — Ambulatory Visit (INDEPENDENT_AMBULATORY_CARE_PROVIDER_SITE_OTHER): Payer: BC Managed Care – PPO | Admitting: Ophthalmology

## 2020-12-12 DIAGNOSIS — H43812 Vitreous degeneration, left eye: Secondary | ICD-10-CM

## 2020-12-12 DIAGNOSIS — H35342 Macular cyst, hole, or pseudohole, left eye: Secondary | ICD-10-CM

## 2020-12-12 DIAGNOSIS — H25813 Combined forms of age-related cataract, bilateral: Secondary | ICD-10-CM

## 2020-12-12 DIAGNOSIS — H35033 Hypertensive retinopathy, bilateral: Secondary | ICD-10-CM | POA: Diagnosis not present

## 2020-12-12 DIAGNOSIS — H43822 Vitreomacular adhesion, left eye: Secondary | ICD-10-CM | POA: Diagnosis not present

## 2020-12-12 DIAGNOSIS — I10 Essential (primary) hypertension: Secondary | ICD-10-CM

## 2020-12-12 DIAGNOSIS — H35372 Puckering of macula, left eye: Secondary | ICD-10-CM | POA: Diagnosis not present

## 2020-12-12 DIAGNOSIS — E119 Type 2 diabetes mellitus without complications: Secondary | ICD-10-CM

## 2020-12-12 DIAGNOSIS — H3581 Retinal edema: Secondary | ICD-10-CM

## 2021-01-18 NOTE — Progress Notes (Addendum)
[facility] Note  01/23/2021     CHIEF COMPLAINT Patient presents for Retina Follow Up    HISTORY OF PRESENT ILLNESS: Debbie Cochran is a 66 y.o. female who presents to the clinic today for:   HPI     Retina Follow Up   Patient presents with  Other.  In left eye.  Duration of 6 weeks.  Since onset it is gradually improving.  I, the attending physician,  performed the HPI with the patient and updated documentation appropriately.        Comments   6 week VMT/ERM OS-  Patient thinks vision is a little better.        Last edited by Bernarda Caffey, MD on 01/25/2021 12:05 AM.    Pt states she is "getting used" to her vision, no new or worsening fol or floaters  Referring physician: Aretta Nip, MD [place],  [place] [address]  HISTORICAL INFORMATION:   Selected notes from the MEDICAL RECORD NUMBER Referred by Dr. Eulas Post for VMT OS LEE:  Ocular Hx- PMH-    CURRENT MEDICATIONS: No current outpatient medications on file. (Ophthalmic Drugs)   No current facility-administered medications for this visit. (Ophthalmic Drugs)   Current Outpatient Medications (Other)  Medication Sig   acetaminophen (TYLENOL) 650 MG CR tablet Take 1,300 mg by mouth daily as needed for pain.   ALPRAZolam (XANAX) 0.25 MG tablet Take 0.25 mg by mouth 3 (three) times daily as needed for anxiety.    b complex vitamins tablet Take 1 tablet by mouth daily.   busPIRone (BUSPAR) 10 MG tablet 1 tablet   Calcium Carb-Cholecalciferol (CALCIUM 600 + D PO) Take 1 tablet by mouth daily.    cetirizine-pseudoephedrine (ZYRTEC-D) 5-120 MG per tablet Take 1 tablet by mouth daily as needed for allergies.   cholecalciferol (VITAMIN D) 1000 UNITS tablet Take 1,000 Units by mouth daily.   DULoxetine (CYMBALTA) 30 MG capsule 1 capsule   DULoxetine (CYMBALTA) 60 MG capsule Take 60 mg by mouth daily.   fluticasone (VERAMYST) 27.5 MCG/SPRAY nasal spray Place into the  nose.   folic acid (FOLVITE) 295 MCG tablet Take 400 mcg by mouth daily.   glucosamine-chondroitin 500-400 MG tablet Take 2 tablets by mouth daily.   ibuprofen (ADVIL,MOTRIN) 600 MG tablet Take 1 tablet (600 mg total) by mouth every 6 (six) hours as needed.   levothyroxine (SYNTHROID) 75 MCG tablet Take 1 tablet by mouth daily.   lovastatin (MEVACOR) 40 MG tablet Take 40 mg by mouth daily.   magnesium gluconate (MAGONATE) 500 MG tablet Take 500 mg by mouth daily.   Multiple Vitamin (MULTIVITAMIN) tablet Take 1 tablet by mouth daily.   omeprazole (PRILOSEC) 20 MG capsule Take 1 tablet by mouth daily.   Potassium 99 MG TABS 1 tablet   rosuvastatin (CRESTOR) 10 MG tablet Take 1 tablet by mouth daily.   sodium fluoride (PREVIDENT 5000 PLUS) 1.1 % CREA dental cream BRUSH ONCE A DAY BEFORE BEDTIME. NO FOOD OR FLUIDS FOR 30 MINUTES   trandolapril (MAVIK) 4 MG tablet Take 0.5 tablets by mouth daily.   triamterene-hydrochlorothiazide (MAXZIDE) 75-50 MG per tablet Take 0.5 tablets by mouth daily.    Verapamil HCl CR 300 MG CP24 Take 1 capsule (300 mg total) by mouth daily. Please schedule appointment for refills   vitamin C (ASCORBIC ACID) 500 MG tablet Take 500 mg by mouth daily.   Vitamin D, Cholecalciferol, 25 MCG (1000 UT) CAPS  1 capsule   acetaminophen (TYLENOL) 650 MG CR tablet Take by mouth.   Blood Glucose Monitoring Suppl (ACCU-CHEK AVIVA PLUS) w/Device KIT See admin instructions.   busPIRone (BUSPAR) 5 MG tablet Take 1 tablet by mouth 2 (two) times daily. Take 1 tab bid   fluticasone (VERAMYST) 27.5 MCG/SPRAY nasal spray Place 2 sprays into the nose daily. (Patient not taking: Reported on 12/12/2020)   glucose blood (ACCU-CHEK AVIVA PLUS) test strip See admin instructions.   levothyroxine (SYNTHROID, LEVOTHROID) 75 MCG tablet Take 75 mcg by mouth daily before breakfast.   lovastatin (MEVACOR) 40 MG tablet Take by mouth.   magnesium gluconate (MAGONATE) 500 MG tablet Take by mouth. (Patient  not taking: Reported on 12/12/2020)   Microlet Lancets MISC See admin instructions.   omeprazole (PRILOSEC) 20 MG capsule Take 20 mg by mouth daily.   No current facility-administered medications for this visit. (Other)   REVIEW OF SYSTEMS: ROS   Positive for: Gastrointestinal, Neurological, Endocrine, Cardiovascular, Eyes Negative for: Constitutional, Skin, Genitourinary, Musculoskeletal, HENT, Respiratory, Psychiatric, Allergic/Imm, Heme/Lymph Last edited by Leonie Douglas, COA on 01/23/2021  8:05 AM.      ALLERGIES Allergies  Allergen Reactions   Hyoscyamine Sulfate Other (See Comments)    Other Reaction: Other reaction   Penicillins Hives and Swelling    Has patient had a PCN reaction causing immediate rash, facial/tongue/throat swelling, SOB or lightheadedness with hypotension: Yes Has patient had a PCN reaction causing severe rash involving mucus membranes or skin necrosis: No Has patient had a PCN reaction that required hospitalization No Has patient had a PCN reaction occurring within the last 10 years: No If all of the above answers are "NO", then may proceed with Cephalosporin use.    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   HYSTEROSCOPY WITH D & C N/A 02/14/2015   Procedure: DILATATION AND CURETTAGE /HYSTEROSCOPY with MYOSURE;  Surgeon: Thurnell Lose, MD;  Location: [facility];   Service: Gynecology;  Laterality: N/A;   TONSILLECTOMY     FAMILY HISTORY Family History  Problem Relation Age of Onset   Dementia Mother    Breast cancer Mother 50   Dementia Father    Arthritis Father    Breast cancer Sister 52   Breast cancer Maternal Aunt 68   SOCIAL HISTORY Social History   Tobacco Use   Smoking status: Former    Packs/day: 0.01    Years: 35.00    Pack years: 0.35    Types: Cigarettes    Quit date: 06/15/2013    Years since quitting: 7.6   Smokeless tobacco: Never  Substance Use Topics   Alcohol use: Yes    Comment: once in a while not daily   Drug use: No  OPHTHALMIC EXAM: Base Eye Exam     Visual Acuity (Snellen - Linear)       Right Left   Dist cc 20/20 -2 20/25 -2   Dist ph cc  NI         Tonometry (Tonopen, 8:11 AM)       Right Left   Pressure 19 18         Pupils       Dark Light Shape React APD   Right 3 2 Round Brisk None   Left 3 2 Round Brisk None         Visual Fields (Counting fingers)       Left Right    Full Full         Extraocular Movement       Right Left    Full Full         Neuro/Psych     Oriented x3: Yes   Mood/Affect: Normal         Dilation     Both eyes: 1.0% Mydriacyl, 2.5% Phenylephrine @ 8:11 AM           Slit Lamp and Fundus Exam     Slit Lamp Exam       Right Left   Lids/Lashes Dermatochalasis - upper lid, mild MGD Dermatochalasis - upper lid, mild MGD   Conjunctiva/Sclera White and quiet White and quiet   Cornea mild arcus, trace PEE mild arcus, trace PEE   Anterior Chamber Deep and quiet Deep and quiet   Iris Round and dilated Round and dilated   Lens 2+ Cortical cataract, 2+ Nuclear sclerosis 2+ Cortical cataract, 2+ Nuclear sclerosis   Anterior Vitreous Vitreous syneresis Vitreous syneresis, no pigment, Posterior vitreous detachment, prominent vitreous condensations         Fundus Exam       Right Left   Disc Pink and Sharp, mild PPA Pink and  Sharp, mild PPA   C/D Ratio 0.6 0.5   Macula Flat, Good foveal reflex, RPE mottling, No heme or edema Flat, Blunted foveal reflex, ERM with central cyst - improved, no heme   Vessels mild attenuation, mild tortuousity attenuated, Tortuous, mild Copper wiring   Periphery Attached, cluster of DBH IN periphery -- improved, mild paving stone degeneration inferiorly, No RT/RD Attached, VR tuft at 0430, No heme, mild reticular degeneration, No RT/RD           Refraction     Wearing Rx       Sphere Cylinder Axis Add   Right +0.00 +0.25 180 +2.50   Left -0.25 +0.25 180 +2.50           IMAGING AND PROCEDURES  Imaging and Procedures for 01/23/2021  OCT, Retina - OU - Both Eyes       Right Eye Quality was good. Central Foveal Thickness: 317. Progression has been stable. Findings include normal foveal contour, no IRF, no SRF, vitreomacular adhesion .   Left Eye Quality was good. Central Foveal Thickness: 330. Progression has improved. Findings include intraretinal fluid, no SRF, epiretinal membrane, normal foveal contour (stable release of VMT to apparent full PVD, interval improvement in cystic changes superior fovea, stable, mild ERM superior macula).   Notes *Images captured and stored on drive  Diagnosis / Impression:  OD: NFP, no IRF/SRF OS: stable release of VMT to apparent full PVD, interval improvement in cystic changes superior fovea, stable, mild ERM superior macula  Clinical management:  See below  Abbreviations: NFP -  Normal foveal profile. CME - cystoid macular edema. PED - pigment epithelial detachment. IRF - intraretinal fluid. SRF - subretinal fluid. EZ - ellipsoid zone. ERM - epiretinal membrane. ORA - outer retinal atrophy. ORT - outer retinal tubulation. SRHM - subretinal hyper-reflective material. IRHM - intraretinal hyper-reflective material            ASSESSMENT/PLAN:    ICD-10-CM   1. Vitreomacular traction syndrome, left  H43.822     2. Epiretinal  membrane (ERM) of left eye  H35.372 OCT, Retina - OU - Both Eyes    3. Macular cyst, hole, or pseudohole, left eye  H35.342 OCT, Retina - OU - Both Eyes    4. Posterior vitreous detachment of left eye  H43.812 OCT, Retina - OU - Both Eyes    5. Diabetes mellitus type 2 without retinopathy ([place])  E11.9     6. Essential hypertension  I10     7. Hypertensive retinopathy of both eyes  H35.033     8. Combined forms of age-related cataract of both eyes  H25.813       1-3. VMT + ERM w/ macular pucker and cyst OS  - pt initially reported 2-3 wks history of blurred central vision OS  - denies significant metamorphopsia - OCT shows stable release of VMT to full PVD, interval improvement in cystic changes superior fovea, stable, mild ERM superior macula - discussed findings, prognosis  - no retinal or ophthalmic interventions indicated or recommended - monitor for now  - f/u 3-4 months, DFE, OCT  4. PVD / vitreous syneresis OS  - onset of symptomatic floaters and intermittent flashes ~early Nov 2022 -- improved  - Discussed findings and prognosis  - No RT or RD on 360 scleral depressed exam  - Reviewed s/s of RT/RD  - Strict return precautions for any such RT/RD signs/symptoms  5. Diabetes mellitus, type 2 without retinopathy - The incidence, risk factors for progression, natural history and treatment options for diabetic retinopathy  were discussed with patient.   - The need for close monitoring of blood glucose, blood pressure, and serum lipids, avoiding cigarette or any type of tobacco, and the need for long term follow up was also discussed with patient. - f/u in 1 year, sooner prn  6,7. Hypertensive retinopathy OU - discussed importance of tight BP control - monitor  8. Mixed Cataract OU - The symptoms of cataract, surgical options, and treatments and risks were discussed with patient. - discussed diagnosis and progression - Under the expert management of Dr. Eulas Post - monitor  for now  Ophthalmic Meds Ordered this visit:  No orders of the defined types were placed in this encounter.    Return for f/u 3-4 months, VMT + ERM OS, DFE, OCT.  There are no Patient Instructions on file for this visit.   Explained the diagnoses, plan, and follow up with the patient and they expressed understanding.  Patient expressed understanding of the importance of proper follow up care.   This document serves as a record of services personally performed by Gardiner Sleeper, MD, PhD. It was created on their behalf by Roselee Nova, COMT. The creation of this record is the provider's dictation and/or activities during the visit.  Electronically signed by: Roselee Nova, COMT 01/25/21 12:12 AM  This document serves as a record of services personally performed by Gardiner Sleeper, MD, PhD. It was created on their behalf by San Jetty. Owens Shark, OA an ophthalmic technician. The creation of this record  is the provider's dictation and/or activities during the visit.    Electronically signed by: San Jetty. Owens Shark, [place] 01.09.2023 12:12 AM  Gardiner Sleeper, M.D., Ph.D. Diseases & Surgery of the Retina and Vitreous [facility]  I have reviewed the above documentation for accuracy and completeness, and I agree with the above. Gardiner Sleeper, M.D., Ph.D. 01/25/21 12:12 AM   Abbreviations: M myopia (nearsighted); A astigmatism; H hyperopia (farsighted); P presbyopia; Mrx spectacle prescription;  CTL contact lenses; OD right eye; OS left eye; OU both eyes  XT exotropia; ET esotropia; PEK punctate epithelial keratitis; PEE punctate epithelial erosions; DES dry eye syndrome; MGD meibomian gland dysfunction; ATs artificial tears; PFAT's preservative free artificial tears; [place] nuclear sclerotic cataract; PSC posterior subcapsular cataract; ERM epi-retinal membrane; PVD posterior vitreous detachment; RD retinal detachment; DM diabetes mellitus; DR diabetic retinopathy; NPDR  non-proliferative diabetic retinopathy; PDR proliferative diabetic retinopathy; CSME clinically significant macular edema; DME diabetic macular edema; dbh dot blot hemorrhages; CWS cotton wool spot; POAG primary open angle glaucoma; C/D cup-to-disc ratio; HVF humphrey visual field; GVF goldmann visual field; OCT optical coherence tomography; IOP intraocular pressure; BRVO Branch retinal vein occlusion; CRVO central retinal vein occlusion; CRAO central retinal artery occlusion; BRAO branch retinal artery occlusion; RT retinal tear; SB scleral buckle; PPV pars plana vitrectomy; VH Vitreous hemorrhage; PRP panretinal laser photocoagulation; IVK intravitreal kenalog; VMT vitreomacular traction; MH Macular hole;  NVD neovascularization of the disc; NVE neovascularization elsewhere; AREDS age related eye disease study; ARMD age related macular degeneration; POAG primary open angle glaucoma; EBMD epithelial/anterior basement membrane dystrophy; ACIOL anterior chamber intraocular lens; IOL intraocular lens; PCIOL posterior chamber intraocular lens; Phaco/IOL phacoemulsification with intraocular lens placement; [place] photorefractive keratectomy; LASIK laser assisted in situ keratomileusis; HTN hypertension; DM diabetes mellitus; COPD chronic obstructive pulmonary disease

## 2021-01-23 ENCOUNTER — Other Ambulatory Visit: Payer: Self-pay

## 2021-01-23 ENCOUNTER — Ambulatory Visit (INDEPENDENT_AMBULATORY_CARE_PROVIDER_SITE_OTHER): Payer: BC Managed Care – PPO | Admitting: Ophthalmology

## 2021-01-23 ENCOUNTER — Encounter (INDEPENDENT_AMBULATORY_CARE_PROVIDER_SITE_OTHER): Payer: Self-pay | Admitting: Ophthalmology

## 2021-01-23 DIAGNOSIS — I10 Essential (primary) hypertension: Secondary | ICD-10-CM

## 2021-01-23 DIAGNOSIS — H43822 Vitreomacular adhesion, left eye: Secondary | ICD-10-CM

## 2021-01-23 DIAGNOSIS — H35372 Puckering of macula, left eye: Secondary | ICD-10-CM | POA: Diagnosis not present

## 2021-01-23 DIAGNOSIS — H35033 Hypertensive retinopathy, bilateral: Secondary | ICD-10-CM

## 2021-01-23 DIAGNOSIS — E119 Type 2 diabetes mellitus without complications: Secondary | ICD-10-CM

## 2021-01-23 DIAGNOSIS — H35342 Macular cyst, hole, or pseudohole, left eye: Secondary | ICD-10-CM | POA: Diagnosis not present

## 2021-01-23 DIAGNOSIS — H43812 Vitreous degeneration, left eye: Secondary | ICD-10-CM

## 2021-01-23 DIAGNOSIS — H25813 Combined forms of age-related cataract, bilateral: Secondary | ICD-10-CM

## 2021-01-25 ENCOUNTER — Encounter (INDEPENDENT_AMBULATORY_CARE_PROVIDER_SITE_OTHER): Payer: Self-pay | Admitting: Ophthalmology

## 2021-05-15 ENCOUNTER — Other Ambulatory Visit: Payer: Self-pay | Admitting: Family Medicine

## 2021-05-15 DIAGNOSIS — Z1231 Encounter for screening mammogram for malignant neoplasm of breast: Secondary | ICD-10-CM

## 2021-05-24 NOTE — Progress Notes (Addendum)
?[facility] Note ? ?05/29/2021 ? ?  ? ?CHIEF COMPLAINT ?Patient presents for Retina Follow Up ? ? ? ?HISTORY OF PRESENT ILLNESS: ?Debbie Cochran is a 66 y.o. female who presents to the clinic today for:  ? ?HPI   ? ? Retina Follow Up   ?Patient presents with  Other.  In left eye.  Severity is moderate.  Duration of 4 months.  Since onset it is stable.  I, the attending physician,  performed the HPI with the patient and updated documentation appropriately. ? ?  ?  ? ? Comments   ?Pt here for 4 mo ret f/u VMT OS. Pt states VA is about the same, though the smudging she has in OS seems a bit more prominent than before.  ? ?  ?  ?Last edited by Bernarda Caffey, MD on 05/29/2021  8:52 AM.  ?  ? ?Pt feels like she still has a "smudge" in her vision OS, she states it moves around, but is always there, just not as noticeable sometimes ? ?Referring physician: ?Lonia Skinner, MD ?[address] ?[place],  [place] [address] ? ?HISTORICAL INFORMATION:  ? ?Selected notes from the [facility] ?Referred by Dr. Eulas Post for VMT OS ?LEE:  ?Ocular Hx- ?PMH- ?  ? ?CURRENT MEDICATIONS: ?No current outpatient medications on file. (Ophthalmic Drugs)  ? ?No current facility-administered medications for this visit. (Ophthalmic Drugs)  ? ?Current Outpatient Medications (Other)  ?Medication Sig  ? acetaminophen (TYLENOL) 650 MG CR tablet Take 1,300 mg by mouth daily as needed for pain.  ? acetaminophen (TYLENOL) 650 MG CR tablet Take by mouth.  ? ALPRAZolam (XANAX) 0.25 MG tablet Take 0.25 mg by mouth 3 (three) times daily as needed for anxiety.   ? b complex vitamins tablet Take 1 tablet by mouth daily.  ? Blood Glucose Monitoring Suppl (ACCU-CHEK AVIVA PLUS) w/Device KIT See admin instructions.  ? busPIRone (BUSPAR) 10 MG tablet 1 tablet  ? busPIRone (BUSPAR) 5 MG tablet Take 1 tablet by mouth 2 (two) times daily. Take 1 tab bid  ? Calcium Carb-Cholecalciferol (CALCIUM 600 + D PO) Take 1 tablet by  mouth daily.   ? cetirizine-pseudoephedrine (ZYRTEC-D) 5-120 MG per tablet Take 1 tablet by mouth daily as needed for allergies.  ? cholecalciferol (VITAMIN D) 1000 UNITS tablet Take 1,000 Units by mouth daily.  ? DULoxetine (CYMBALTA) 30 MG capsule 1 capsule  ? DULoxetine (CYMBALTA) 60 MG capsule Take 60 mg by mouth daily.  ? fluticasone (VERAMYST) 27.5 MCG/SPRAY nasal spray Place into the nose.  ? folic acid (FOLVITE) 026 MCG tablet Take 400 mcg by mouth daily.  ? glucosamine-chondroitin 500-400 MG tablet Take 2 tablets by mouth daily.  ? glucose blood (ACCU-CHEK AVIVA PLUS) test strip See admin instructions.  ? ibuprofen (ADVIL,MOTRIN) 600 MG tablet Take 1 tablet (600 mg total) by mouth every 6 (six) hours as needed.  ? levothyroxine (SYNTHROID) 75 MCG tablet Take 1 tablet by mouth daily.  ? levothyroxine (SYNTHROID, LEVOTHROID) 75 MCG tablet Take 75 mcg by mouth daily before breakfast.  ? lovastatin (MEVACOR) 40 MG tablet Take 40 mg by mouth daily.  ? lovastatin (MEVACOR) 40 MG tablet Take by mouth.  ? magnesium gluconate (MAGONATE) 500 MG tablet Take 500 mg by mouth daily.  ? Microlet Lancets MISC See admin instructions.  ? Multiple Vitamin (MULTIVITAMIN) tablet Take 1 tablet by mouth daily.  ? omeprazole (PRILOSEC) 20 MG capsule Take 20  mg by mouth daily.  ? omeprazole (PRILOSEC) 20 MG capsule Take 1 tablet by mouth daily.  ? Potassium 99 MG TABS 1 tablet  ? rosuvastatin (CRESTOR) 10 MG tablet Take 1 tablet by mouth daily.  ? sodium fluoride (PREVIDENT 5000 PLUS) 1.1 % CREA dental cream BRUSH ONCE A DAY BEFORE BEDTIME. NO FOOD OR FLUIDS FOR 30 MINUTES  ? trandolapril (MAVIK) 4 MG tablet Take 0.5 tablets by mouth daily.  ? triamterene-hydrochlorothiazide (MAXZIDE) 75-50 MG per tablet Take 0.5 tablets by mouth daily.   ? Verapamil HCl CR 300 MG CP24 Take 1 capsule (300 mg total) by mouth daily. Please schedule appointment for refills  ? vitamin C (ASCORBIC ACID) 500 MG tablet Take 500 mg by mouth daily.  ?  Vitamin D, Cholecalciferol, 25 MCG (1000 UT) CAPS 1 capsule  ? fluticasone (VERAMYST) 27.5 MCG/SPRAY nasal spray Place 2 sprays into the nose daily. (Patient not taking: Reported on 12/12/2020)  ? magnesium gluconate (MAGONATE) 500 MG tablet Take by mouth. (Patient not taking: Reported on 12/12/2020)  ? ?No current facility-administered medications for this visit. (Other)  ? ?REVIEW OF SYSTEMS: ?ROS   ?Positive for: Gastrointestinal, Neurological, Endocrine, Cardiovascular, Eyes ?Negative for: Constitutional, Skin, Genitourinary, Musculoskeletal, HENT, Respiratory, Psychiatric, Allergic/Imm, Heme/Lymph ?Last edited by Kingsley Spittle, COT on 05/29/2021  8:03 AM.  ?  ? ?ALLERGIES ?Allergies  ?Allergen Reactions  ? Hyoscyamine Sulfate Other (See Comments)  ?  Other Reaction: Other reaction  ? Penicillins Hives and Swelling  ?  Has patient had a PCN reaction causing immediate rash, facial/tongue/throat swelling, SOB or lightheadedness with hypotension: Yes ?Has patient had a PCN reaction causing severe rash involving mucus membranes or skin necrosis: No ?Has patient had a PCN reaction that required hospitalization No ?Has patient had a PCN reaction occurring within the last 10 years: No ?If all of the above answers are "NO", then may proceed with Cephalosporin use. ?  ? ?PAST MEDICAL HISTORY ?Past Medical History:  ?Diagnosis Date  ? Allergy   ? Anemia   ? history of  ? Anxiety   ? Arthritis   ? Cataract   ? Depression   ? Diabetes mellitus without complication ([place])   ? Essential hypertension 02/01/2015  ? GERD (gastroesophageal reflux disease)   ? Headache   ? history of  ? Hyperlipidemia   ? Hypertension   ? Hypertensive retinopathy   ? Hypothyroidism   ? Palpitations 02/01/2015  ? Polycythemia, secondary 09/10/2013  ? PONV (postoperative nausea and vomiting)   ? Thyroid disease   ? ?Past Surgical History:  ?Procedure Laterality Date  ? BUNIONECTOMY Right   ? COLONOSCOPY    ? GASTRIC BYPASS  2007  ? Roux en Y  ?  HYSTEROSCOPY WITH D & C N/A 02/14/2015  ? Procedure: DILATATION AND CURETTAGE /HYSTEROSCOPY with MYOSURE;  Surgeon: Thurnell Lose, MD;  Location: [facility];  Service: Gynecology;  Laterality: N/A;  ? TONSILLECTOMY    ? ?FAMILY HISTORY ?Family History  ?Problem Relation Age of Onset  ? Dementia Mother   ? Breast cancer Mother 56  ? Dementia Father   ? Arthritis Father   ? Breast cancer Sister 23  ? Breast cancer Maternal Aunt 15  ? ?SOCIAL HISTORY ?Social History  ? ?Tobacco Use  ? Smoking status: Former  ?  Packs/day: 0.01  ?  Years: 35.00  ?  Pack years: 0.35  ?  Types: Cigarettes  ?  Quit date: 06/15/2013  ?  Years since quitting: 7.9  ?  Smokeless tobacco: Never  ?Substance Use Topics  ? Alcohol use: Yes  ?  Comment: once in a while not daily  ? Drug use: No  ?  ? ?  ?OPHTHALMIC EXAM: ?Base Eye Exam   ? ? Visual Acuity (Snellen - Linear)   ? ?   Right Left  ? Dist cc 20/25 +2 20/25 +1  ? Dist ph cc NI NI  ? ? Correction: Glasses  ? ?  ?  ? ? Tonometry (Tonopen, 8:10 AM)   ? ?   Right Left  ? Pressure 15 20  ? ?  ?  ? ? Pupils   ? ?   Dark Light Shape React APD  ? Right 3 2 Round Brisk None  ? Left 3 2 Round Brisk None  ? ?  ?  ? ? Visual Fields (Counting fingers)   ? ?   Left Right  ?  Full Full  ? ?  ?  ? ? Extraocular Movement   ? ?   Right Left  ?  Full, Ortho Full, Ortho  ? ?  ?  ? ? Neuro/Psych   ? ? Oriented x3: Yes  ? Mood/Affect: Normal  ? ?  ?  ? ? Dilation   ? ? Both eyes: 1.0% Mydriacyl, 2.5% Phenylephrine @ 8:11 AM  ? ?  ?  ? ?  ? ?Slit Lamp and Fundus Exam   ? ? Slit Lamp Exam   ? ?   Right Left  ? Lids/Lashes Dermatochalasis - upper lid, mild MGD Dermatochalasis - upper lid, mild MGD  ? Conjunctiva/Sclera White and quiet White and quiet  ? Cornea mild arcus, trace PEE mild arcus, trace PEE  ? Anterior Chamber Deep and quiet Deep and quiet  ? Iris Round and dilated Round and dilated  ? Lens 2+ Cortical cataract, 2+ Nuclear sclerosis 2+ Cortical cataract, 2+ Nuclear sclerosis  ? Anterior Vitreous Vitreous  syneresis Vitreous syneresis, no pigment, Posterior vitreous detachment, prominent vitreous condensations  ? ?  ?  ? ? Fundus Exam   ? ?   Right Left  ? Disc Pink and Sharp, mild PPA Pink and Sharp, mild PPP

## 2021-05-25 ENCOUNTER — Ambulatory Visit
Admission: RE | Admit: 2021-05-25 | Discharge: 2021-05-25 | Disposition: A | Discharge status: Home / Self Care | Payer: BC Managed Care – PPO | Source: Ambulatory Visit | Attending: Family Medicine | Admitting: Family Medicine

## 2021-05-25 DIAGNOSIS — Z1231 Encounter for screening mammogram for malignant neoplasm of breast: Secondary | ICD-10-CM

## 2021-05-29 ENCOUNTER — Ambulatory Visit (INDEPENDENT_AMBULATORY_CARE_PROVIDER_SITE_OTHER): Payer: BC Managed Care – PPO | Admitting: Ophthalmology

## 2021-05-29 ENCOUNTER — Encounter (INDEPENDENT_AMBULATORY_CARE_PROVIDER_SITE_OTHER): Payer: Self-pay | Admitting: Ophthalmology

## 2021-05-29 DIAGNOSIS — H35342 Macular cyst, hole, or pseudohole, left eye: Secondary | ICD-10-CM | POA: Diagnosis not present

## 2021-05-29 DIAGNOSIS — H35372 Puckering of macula, left eye: Secondary | ICD-10-CM | POA: Diagnosis not present

## 2021-05-29 DIAGNOSIS — H43812 Vitreous degeneration, left eye: Secondary | ICD-10-CM

## 2021-05-29 DIAGNOSIS — I10 Essential (primary) hypertension: Secondary | ICD-10-CM | POA: Diagnosis not present

## 2021-05-29 DIAGNOSIS — H43822 Vitreomacular adhesion, left eye: Secondary | ICD-10-CM

## 2021-05-29 DIAGNOSIS — H35033 Hypertensive retinopathy, bilateral: Secondary | ICD-10-CM

## 2021-05-29 DIAGNOSIS — E119 Type 2 diabetes mellitus without complications: Secondary | ICD-10-CM

## 2021-05-29 DIAGNOSIS — H25813 Combined forms of age-related cataract, bilateral: Secondary | ICD-10-CM

## 2021-08-31 ENCOUNTER — Telehealth: Payer: Self-pay | Admitting: Gastroenterology

## 2021-08-31 NOTE — Telephone Encounter (Signed)
Good afternoon Dr Tarri Glenn,  Supervising MD 08-04-21 PM.  We have received a request from patient for transfer from [facility] due to previous provider Dr Earlean Shawl retiring. Patient has a referral to have a colonoscopy done.  Patient last procedure was 08/21/2016 with Dr Earlean Shawl. I will be sending records for review. Please review and advise on scheduling.  Thank you

## 2021-09-05 NOTE — Telephone Encounter (Signed)
Prior records from Dr. Earlean Shawl reviewed.  Family history of polyps (Brother)  Colonoscopy with Dr. Earlean Shawl 08/21/2016 revealed 310 mm polyps in the transverse colon and ascending colon and internal hemorrhoids.  At least one of the polyps was a tubular adenoma.  Surveillance colonoscopy recommended in 3 years.

## 2021-09-06 ENCOUNTER — Encounter: Payer: Self-pay | Admitting: Gastroenterology

## 2021-10-11 ENCOUNTER — Ambulatory Visit (AMBULATORY_SURGERY_CENTER): Payer: Self-pay | Admitting: *Deleted

## 2021-10-11 VITALS — Ht 65.0 in | Wt 241.4 lb

## 2021-10-11 DIAGNOSIS — Z86010 Personal history of colonic polyps: Secondary | ICD-10-CM

## 2021-10-11 MED ORDER — NA SULFATE-K SULFATE-MG SULF 17.5-3.13-1.6 GM/177ML PO SOLN: 1.0000 | Freq: Once | ORAL | 0 refills | Status: AC

## 2021-10-11 NOTE — Progress Notes (Signed)
No egg or soy allergy known to patient  No issues known to pt with past sedation with any surgeries or procedures Patient denies ever being told they had issues or difficulty with intubation  No FH of Malignant Hyperthermia Pt is not on diet pills Pt is not on home 02  Pt is not on blood thinners  Pt denies issues with constipation  No A fib or A flutter Have any cardiac testing pending--NO Pt instructed to use [email or web address] or GoodRx for a price reduction on prep   

## 2021-11-01 ENCOUNTER — Encounter: Payer: Self-pay | Admitting: Gastroenterology

## 2021-11-10 ENCOUNTER — Encounter: Payer: Self-pay | Admitting: Gastroenterology

## 2021-11-10 ENCOUNTER — Ambulatory Visit (AMBULATORY_SURGERY_CENTER): Payer: BC Managed Care – PPO | Admitting: Gastroenterology

## 2021-11-10 VITALS — BP 122/72 | HR 77 | Temp 97.5°F | Resp 14 | Ht 65.0 in | Wt 241.0 lb

## 2021-11-10 DIAGNOSIS — Z538 Procedure and treatment not carried out for other reasons: Secondary | ICD-10-CM

## 2021-11-10 DIAGNOSIS — Z09 Encounter for follow-up examination after completed treatment for conditions other than malignant neoplasm: Secondary | ICD-10-CM

## 2021-11-10 DIAGNOSIS — Z86010 Personal history of colonic polyps: Secondary | ICD-10-CM | POA: Diagnosis not present

## 2021-11-10 MED ORDER — SODIUM CHLORIDE 0.9 % IV SOLN: 500.0000 mL | Freq: Once | INTRAVENOUS | Status: DC

## 2021-11-10 NOTE — Progress Notes (Signed)
Pt's states no medical or surgical changes since previsit or office visit. 

## 2021-11-10 NOTE — Progress Notes (Signed)
Referring Provider: Aretta Nip, MD Primary Care Physician:  Cari Caraway, MD  Indication for Procedure:  Colon cancer Surveillance   IMPRESSION:  Need for colon cancer surveillance Appropriate candidate for monitored anesthesia care  PLAN: Colonoscopy in the [facility] today   HPI: Debbie Cochran is a 66 y.o. female presents for surveillance colonoscopy.  Prior endoscopic history: Multiple prior colonoscopies with Dr. Allyn Kenner. Last colonoscopy with Dr. Earlean Shawl 08/21/2016 revealed 3 10 mm polyps in the transverse colon and ascending colon and internal hemorrhoids.  At least one of the polyps was a tubular adenoma.  Surveillance colonoscopy recommended in 3 years.  Brother with colon polyps. No other known family history of colon cancer or polyps. No family history of uterine/endometrial cancer, pancreatic cancer or gastric/stomach cancer.   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     busPIRone (BUSPAR) 10 MG tablet 2 (two) times  daily.     Calcium Carb-Cholecalciferol (CALCIUM 600 + D PO) Take 1 tablet by mouth daily.      cholecalciferol (VITAMIN D) 1000 UNITS tablet Take 1,000 Units by mouth daily.     DULoxetine (CYMBALTA) 30 MG capsule daily.     DULoxetine (CYMBALTA) 60 MG capsule Take 60 mg by mouth daily.     levothyroxine (SYNTHROID, LEVOTHROID) 75 MCG tablet Take 75 mcg by mouth daily before breakfast.     magnesium gluconate (MAGONATE) 500 MG tablet Take 400 mg by mouth in the morning and at bedtime.     metFORMIN (GLUCOPHAGE-XR) 500 MG 24 hr tablet SMARTSIG:1 Tablet(s) By Mouth Every Evening     Multiple Vitamin (MULTIVITAMIN) tablet Take 1 tablet by mouth daily.     omeprazole (PRILOSEC) 20 MG capsule Take 1 tablet by mouth daily.     Potassium 99 MG TABS 90 mg daily.     rosuvastatin (CRESTOR) 20 MG tablet Take 20 mg by mouth daily.     verapamil (CALAN-SR) 240 MG CR tablet Take 240 mg by mouth daily.     acetaminophen (TYLENOL) 650 MG CR tablet Take by mouth daily. TAKE 2 DAILY (Patient not taking: Reported on 10/11/2021)     ALPRAZolam (XANAX) 0.25 MG tablet Take 0.25 mg by mouth 3 (three) times daily as needed for anxiety.      b complex vitamins tablet Take 1 tablet by mouth daily.  Blood Glucose Monitoring Suppl (ACCU-CHEK AVIVA PLUS) w/Device KIT See admin instructions. (Patient not taking: Reported on 10/11/2021)     cetirizine-pseudoephedrine (ZYRTEC-D) 5-120 MG per tablet Take 1 tablet by mouth daily as needed for allergies.     fluticasone (VERAMYST) 27.5 MCG/SPRAY nasal spray Place 2 sprays into the nose daily. (Patient not taking: Reported on 12/12/2020)     fluticasone (VERAMYST) 27.5 MCG/SPRAY nasal spray Place into the nose daily.     folic acid (FOLVITE) 132 MCG tablet Take 800 mcg by mouth daily.     glucosamine-chondroitin 500-400 MG tablet Take 2 tablets by mouth daily.     glucose blood (ACCU-CHEK AVIVA PLUS) test strip See admin instructions. (Patient not taking: Reported on  10/11/2021)     ibuprofen (ADVIL,MOTRIN) 600 MG tablet Take 1 tablet (600 mg total) by mouth every 6 (six) hours as needed. 30 tablet 0   Microlet Lancets MISC See admin instructions. (Patient not taking: Reported on 10/11/2021)     sodium fluoride (PREVIDENT 5000 PLUS) 1.1 % CREA dental cream BRUSH ONCE A DAY BEFORE BEDTIME. NO FOOD OR FLUIDS FOR 30 MINUTES (Patient not taking: Reported on 10/11/2021)     trandolapril (MAVIK) 4 MG tablet Take 0.5 tablets by mouth daily. (Patient not taking: Reported on 10/11/2021)  0   triamterene-hydrochlorothiazide (MAXZIDE) 75-50 MG per tablet Take 0.5 tablets by mouth daily.  (Patient not taking: Reported on 10/11/2021)     Verapamil HCl CR 300 MG CP24 Take 1 capsule (300 mg total) by mouth daily. Please schedule appointment for refills (Patient not taking: Reported on 10/11/2021) 15 capsule 0   vitamin C (ASCORBIC ACID) 500 MG tablet Take 500 mg by mouth daily. (Patient not taking: Reported on 10/11/2021)     Vitamin D, Cholecalciferol, 25 MCG (1000 UT) CAPS 1 capsule (Patient not taking: Reported on 10/11/2021)     Current Facility-Administered Medications  Medication Dose Route Frequency Provider Last Rate Last Admin   0.9 %  sodium chloride infusion  500 mL Intravenous Once Thornton Park, MD        Allergies as of 11/10/2021 - Review Complete 11/10/2021  Allergen Reaction Noted   Hyoscyamine sulfate Other (See Comments) 02/01/2015   Penicillins Hives and Swelling 11/28/2012    Family History  Problem Relation Age of Onset   Dementia Mother    Breast cancer Mother 4   Dementia Father    Arthritis Father    Breast cancer Sister 16   Breast cancer Maternal Aunt 68   Colon cancer Neg Hx    Colon polyps Neg Hx    Crohn's disease Neg Hx    Esophageal cancer Neg Hx    Rectal cancer Neg Hx    Stomach cancer Neg Hx    Ulcerative colitis Neg Hx      Physical Exam: General:   Alert,  well-nourished, pleasant and cooperative in NAD Head:   Normocephalic and atraumatic. Eyes:  Sclera clear, no icterus.   Conjunctiva pink. Mouth:  No deformity or lesions.   Neck:  Supple; no masses or thyromegaly. Lungs:  Clear throughout to auscultation.   No wheezes. Heart:  Regular rate and rhythm; no murmurs. Abdomen:  Soft, non-tender, nondistended, normal bowel sounds, no rebound or guarding.  Msk:  Symmetrical. No boney deformities LAD: No inguinal or umbilical LAD Extremities:  No clubbing or edema. Neurologic:  Alert and  oriented x4;  grossly nonfocal Skin:  No obvious rash or bruise. Psych:  Alert and cooperative. Normal mood  and affect.     Studies/Results: No results found.    Lerry Cordrey L. Tarri Glenn, MD, MPH 11/10/2021, 9:06 AM

## 2021-11-10 NOTE — Op Note (Addendum)
[facility] Patient Name: Debbie Cochran Procedure Date: 11/10/2021 9:08 AM MRN: [id number] Endoscopist: Thornton Park MD, MD, [id number] Age: 66 Referring MD:  Date of Birth: 1955/08/16 Gender: Female Account #: [id number] Procedure:                Colonoscopy Indications:              High risk colon cancer surveillance: Personal                            history of multiple (3 or more) adenomas                           Polyps on multiple prior colonoscopies with Dr.                            Allyn Kenner.                           Last colonoscopy with Dr. Earlean Shawl 08/21/2016 revealed                            3 10 mm polyps in the transverse colon and                            ascending colon and internal hemorrhoids. At least                            one of the polyps was a tubular adenoma.                            Surveillance colonoscopy recommended in 3 years.                           Brother with colon polyps.                           No known family history of colon cancer. Medicines:                Monitored Anesthesia Care Procedure:                Pre-Anesthesia Assessment:                           - Prior to the procedure, a History and Physical                            was performed, and patient medications and                            allergies were reviewed. The patient's tolerance of                            previous anesthesia was also reviewed. The risks  and benefits of the procedure and the sedation                            options and risks were discussed with the patient.                            All questions were answered, and informed consent                            was obtained. Prior Anticoagulants: The patient has                            taken no anticoagulant or antiplatelet agents. ASA                            Grade Assessment: III - A patient with severe                            systemic  disease. After reviewing the risks and                            benefits, the patient was deemed in satisfactory                            condition to undergo the procedure.                           After obtaining informed consent, the colonoscope                            was passed under direct vision. Throughout the                            procedure, the patient's blood pressure, pulse, and                            oxygen saturations were monitored continuously. The                            Olympus CF-HQ190L (Serial# [id number]) Colonoscope was                            introduced through the anus and advanced to the 2                            cm into the ileum. A second forward view of the                            right colon was performed. The colonoscopy was                            performed with moderate difficulty due to  inadequate bowel prep, a redundant colon and a                            tortuous colon. Successful completion of the                            procedure was aided by changing the patient's                            position, withdrawing and reinserting the scope and                            applying abdominal pressure. The patient tolerated                            the procedure well. The quality of the bowel                            preparation was inadequate. The ileocecal valve,                            appendiceal orifice, and rectum were photographed. Scope In: 9:15:38 AM Scope Out: 9:36:39 AM Scope Withdrawal Time: 0 hours 7 minutes 59 seconds  Total Procedure Duration: 0 hours 21 minutes 1 second  Findings:                 The perianal and digital rectal examinations were                            normal.                           A moderate amount of liquid semi-liquid semi-solid                            stool was found in the entire colon. Complications:            No immediate  complications. Estimated Blood Loss:     Estimated blood loss: none. Impression:               - Preparation of the colon was inadequate.                           - Stool in the entire examined colon. Small,                            medium, and flat lesions may have been missed                            during this procedure.                           - No specimens collected. Recommendation:           - Patient has a contact number available for  emergencies. The signs and symptoms of potential                            delayed complications were discussed with the                            patient. Return to normal activities tomorrow.                            Written discharge instructions were provided to the                            patient.                           - Resume previous diet.                           - Continue present medications.                           - Repeat colonoscopy at the next available                            appointment because the bowel preparation was poor.                            Two day bowel prep at that time.                           - Emerging evidence supports eating a diet of                            fruits, vegetables, grains, calcium, and yogurt                            while reducing red meat and alcohol may reduce the                            risk of colon cancer. Thornton Park MD, MD 11/10/2021 9:42:39 AM This report has been signed electronically.

## 2021-11-10 NOTE — Progress Notes (Signed)
A and O x3. Report to RN. Tolerated MAC anesthesia well. 

## 2021-11-10 NOTE — Patient Instructions (Addendum)
- Patient has a contact number available for emergencies. The signs and symptoms of potential delayed complications were discussed with the patient. Return to normal activities tomorrow. Written discharge instructions were provided to the patient. - Resume previous diet. - Continue present medications. - Repeat colonoscopy at the next available appointment because the bowel preparation was poor. Two day bowel prep at that time. - Emerging evidence supports eating a diet of fruits, vegetables, grains, calcium, and yogurt while reducing red meat and alcohol may reduce the risk of colon cancer.  YOU HAD AN ENDOSCOPIC PROCEDURE TODAY AT [facility]:   Refer to the procedure report that was given to you for any specific questions about what was found during the examination.  If the procedure report does not answer your questions, please call your gastroenterologist to clarify.  If you requested that your care partner not be given the details of your procedure findings, then the procedure report has been included in a sealed envelope for you to review at your convenience later.  YOU SHOULD EXPECT: Some feelings of bloating in the abdomen. Passage of more gas than usual.  Walking can help get rid of the air that was put into your GI tract during the procedure and reduce the bloating. If you had a lower endoscopy (such as a colonoscopy or flexible sigmoidoscopy) you may notice spotting of blood in your stool or on the toilet paper. If you underwent a bowel prep for your procedure, you may not have a normal bowel movement for a few days.  Please Note:  You might notice some irritation and congestion in your nose or some drainage.  This is from the oxygen used during your procedure.  There is no need for concern and it should clear up in a day or so.  SYMPTOMS TO REPORT IMMEDIATELY:  Following lower endoscopy (colonoscopy or flexible sigmoidoscopy):  Excessive amounts of blood in the  stool  Significant tenderness or worsening of abdominal pains  Swelling of the abdomen that is new, acute  Fever of 100F or higher   For urgent or emergent issues, a gastroenterologist can be reached at any hour by calling [phone]. Do not use MyChart messaging for urgent concerns.    DIET:  We do recommend a small meal at first, but then you may proceed to your regular diet.  Drink plenty of fluids but you should avoid alcoholic beverages for 24 hours.  ACTIVITY:  You should plan to take it easy for the rest of today and you should NOT DRIVE or use heavy machinery until tomorrow (because of the sedation medicines used during the test).    FOLLOW UP: Our staff will call the number listed on your records the next business day following your procedure.  We will call around 7:15- 8:00 am to check on you and address any questions or concerns that you may have regarding the information given to you following your procedure. If we do not reach you, we will leave a message.     If any biopsies were taken you will be contacted by phone or by letter within the next 1-3 weeks.  Please call us at [phone] if you have not heard about the biopsies in 3 weeks.    SIGNATURES/CONFIDENTIALITY: You and/or your care partner have signed paperwork which will be entered into your electronic medical record.  These signatures attest to the fact that that the information above on your After Visit Summary has been reviewed and  is understood.  Full responsibility of the confidentiality of this discharge information lies with you and/or your care-partner.

## 2021-11-13 ENCOUNTER — Telehealth: Payer: Self-pay | Admitting: *Deleted

## 2021-11-13 NOTE — Telephone Encounter (Signed)
  Follow up Call-     11/10/2021    8:17 AM  Call back number  Post procedure Call Back phone  # [phone]  Permission to leave phone message Yes     Patient questions:  Do you have a fever, pain , or abdominal swelling? No. Pain Score  0 *  Have you tolerated food without any problems? Yes.    Have you been able to return to your normal activities? Yes.    Do you have any questions about your discharge instructions: Diet   No. Medications  No. Follow up visit  No.  Do you have questions or concerns about your Care? No.  Actions: * If pain score is 4 or above: No action needed, pain <4.

## 2021-11-24 ENCOUNTER — Encounter: Payer: Self-pay | Admitting: Gastroenterology

## 2021-12-01 ENCOUNTER — Encounter: Payer: Self-pay | Admitting: Gastroenterology

## 2021-12-01 ENCOUNTER — Ambulatory Visit (AMBULATORY_SURGERY_CENTER): Payer: BC Managed Care – PPO | Admitting: Gastroenterology

## 2021-12-01 VITALS — BP 126/62 | HR 76 | Temp 98.0°F | Resp 16 | Ht 65.0 in | Wt 241.0 lb

## 2021-12-01 DIAGNOSIS — Z86010 Personal history of colonic polyps: Secondary | ICD-10-CM | POA: Diagnosis not present

## 2021-12-01 DIAGNOSIS — Z09 Encounter for follow-up examination after completed treatment for conditions other than malignant neoplasm: Secondary | ICD-10-CM | POA: Diagnosis present

## 2021-12-01 DIAGNOSIS — K635 Polyp of colon: Secondary | ICD-10-CM

## 2021-12-01 DIAGNOSIS — Z1211 Encounter for screening for malignant neoplasm of colon: Secondary | ICD-10-CM

## 2021-12-01 DIAGNOSIS — D123 Benign neoplasm of transverse colon: Secondary | ICD-10-CM

## 2021-12-01 DIAGNOSIS — D120 Benign neoplasm of cecum: Secondary | ICD-10-CM

## 2021-12-01 MED ORDER — SODIUM CHLORIDE 0.9 % IV SOLN: 500.0000 mL | Freq: Once | INTRAVENOUS | Status: AC

## 2021-12-01 NOTE — Patient Instructions (Signed)
Thank you for letting us take care of your healthcare needs today. PLease see handouts given to you on Polyps.    YOU HAD AN ENDOSCOPIC PROCEDURE TODAY AT [facility]:   Refer to the procedure report that was given to you for any specific questions about what was found during the examination.  If the procedure report does not answer your questions, please call your gastroenterologist to clarify.  If you requested that your care partner not be given the details of your procedure findings, then the procedure report has been included in a sealed envelope for you to review at your convenience later.  YOU SHOULD EXPECT: Some feelings of bloating in the abdomen. Passage of more gas than usual.  Walking can help get rid of the air that was put into your GI tract during the procedure and reduce the bloating. If you had a lower endoscopy (such as a colonoscopy or flexible sigmoidoscopy) you may notice spotting of blood in your stool or on the toilet paper. If you underwent a bowel prep for your procedure, you may not have a normal bowel movement for a few days.  Please Note:  You might notice some irritation and congestion in your nose or some drainage.  This is from the oxygen used during your procedure.  There is no need for concern and it should clear up in a day or so.  SYMPTOMS TO REPORT IMMEDIATELY:  Following lower endoscopy (colonoscopy or flexible sigmoidoscopy):  Excessive amounts of blood in the stool  Significant tenderness or worsening of abdominal pains  Swelling of the abdomen that is new, acute  Fever of 100F or higher   For urgent or emergent issues, a gastroenterologist can be reached at any hour by calling [phone]. Do not use MyChart messaging for urgent concerns.    DIET:  We do recommend a small meal at first, but then you may proceed to your regular diet.  Drink plenty of fluids but you should avoid alcoholic beverages for 24 hours.  ACTIVITY:  You  should plan to take it easy for the rest of today and you should NOT DRIVE or use heavy machinery until tomorrow (because of the sedation medicines used during the test).    FOLLOW UP: Our staff will call the number listed on your records the next business day following your procedure.  We will call around 7:15- 8:00 am to check on you and address any questions or concerns that you may have regarding the information given to you following your procedure. If we do not reach you, we will leave a message.     If any biopsies were taken you will be contacted by phone or by letter within the next 1-3 weeks.  Please call us at [phone] if you have not heard about the biopsies in 3 weeks.    SIGNATURES/CONFIDENTIALITY: You and/or your care partner have signed paperwork which will be entered into your electronic medical record.  These signatures attest to the fact that that the information above on your After Visit Summary has been reviewed and is understood.  Full responsibility of the confidentiality of this discharge information lies with you and/or your care-partner.

## 2021-12-01 NOTE — Progress Notes (Unsigned)
Called to room to assist during endoscopic procedure.  Patient ID and intended procedure confirmed with present staff. Received instructions for my participation in the procedure from the performing physician.  

## 2021-12-01 NOTE — Op Note (Signed)
[facility] Patient Name: Debbie Cochran Procedure Date: 12/01/2021 2:59 PM MRN: [id number] Endoscopist: Thornton Park MD, MD, [id number] Age: 66 Referring MD:  Date of Birth: 29-Jan-1955 Gender: Female Account #: [id number] Procedure:                Colonoscopy Indications:              Surveillance: Personal history of colonic polyps                            (unknown histology) on last colonoscopy more than 5                            years ago                           Multiple prior colonoscopies with Dr. Allyn Kenner. Last                            colonoscopy with Dr. Earlean Shawl 08/21/2016 revealed 3 10                            mm polyps in the transverse colon and ascending                            colon and internal hemorrhoids. At least one of the                            polyps was a tubular adenoma. Surveillance                            colonoscopy recommended in 3 years.                           Failed attempt at colonoscopy 11/10/21 due to                            retained stool                           Brother with colon polyps. No other known family                            history of colon cancer or polyps. Medicines:                Monitored Anesthesia Care Procedure:                Pre-Anesthesia Assessment:                           - Prior to the procedure, a History and Physical                            was performed, and patient medications and  allergies were reviewed. The patient's tolerance of                            previous anesthesia was also reviewed. The risks                            and benefits of the procedure and the sedation                            options and risks were discussed with the patient.                            All questions were answered, and informed consent                            was obtained. Prior Anticoagulants: The patient has                            taken no  anticoagulant or antiplatelet agents. ASA                            Grade Assessment: III - A patient with severe                            systemic disease. After reviewing the risks and                            benefits, the patient was deemed in satisfactory                            condition to undergo the procedure.                           After obtaining informed consent, the colonoscope                            was passed under direct vision. Throughout the                            procedure, the patient's blood pressure, pulse, and                            oxygen saturations were monitored continuously. The                            CF [id number] #[id number] was introduced through the anus                            and advanced to the 4 cm into the ileum. A second                            forward view of the right colon was performed. The  colonoscopy was performed without difficulty. The                            patient tolerated the procedure well. The quality                            of the bowel preparation was good. The ileocecal                            valve, appendiceal orifice, and rectum were                            photographed. Scope In: 3:07:17 PM Scope Out: 3:27:27 PM Scope Withdrawal Time: 0 hours 11 minutes 50 seconds  Total Procedure Duration: 0 hours 20 minutes 10 seconds  Findings:                 The perianal and digital rectal examinations were                            normal.                           A 2 mm polyp was found in the hepatic flexure. The                            polyp was sessile. The polyp was removed with a                            cold snare. Resection and retrieval were complete.                            Estimated blood loss was minimal.                           A 2 mm polyp was found in the cecum. The polyp was                            sessile. The polyp was removed with a cold snare.                             Resection and retrieval were complete. Estimated                            blood loss was minimal.                           The exam was otherwise without abnormality on                            direct and retroflexion views. Complications:            No immediate complications. Estimated Blood Loss:     Estimated blood loss was minimal. Impression:               -  One 2 mm polyp at the hepatic flexure, removed                            with a cold snare. Resected and retrieved.                           - One 2 mm polyp in the cecum, removed with a cold                            snare. Resected and retrieved.                           - The examination was otherwise normal on direct                            and retroflexion views. Recommendation:           - Patient has a contact number available for                            emergencies. The signs and symptoms of potential                            delayed complications were discussed with the                            patient. Return to normal activities tomorrow.                            Written discharge instructions were provided to the                            patient.                           - Resume previous diet.                           - Continue present medications.                           - Await pathology results.                           - Repeat colonoscopy in 5 years for surveillance                            regardless of pathology results given the family                            history.                           - Emerging evidence supports eating a diet of  fruits, vegetables, grains, calcium, and yogurt                            while reducing red meat and alcohol may reduce the                            risk of colon cancer.                           - Thank you for allowing me to be involved in your                            colon  cancer prevention. Thornton Park MD, MD 12/01/2021 3:36:50 PM This report has been signed electronically.

## 2021-12-01 NOTE — Progress Notes (Unsigned)
Report to PACU, RN, vss, BBS= Clear.  

## 2021-12-01 NOTE — Progress Notes (Unsigned)
Referring Provider: Cari Caraway, MD Primary Care Physician:  Cari Caraway, MD  Indication for Procedure:  Colon cancer Surveillance   IMPRESSION:  Need for colon cancer surveillance Appropriate candidate for monitored anesthesia care  PLAN: Colonoscopy in the [facility] today   HPI: Debbie Cochran is a 66 y.o. female presents for surveillance colonoscopy.  Prior endoscopic history: Multiple prior colonoscopies with Dr. Allyn Kenner. Last colonoscopy with Dr. Earlean Shawl 08/21/2016 revealed 3 10 mm polyps in the transverse colon and ascending colon and internal hemorrhoids.  At least one of the polyps was a tubular adenoma.  Surveillance colonoscopy recommended in 3 years.  Failed attempt at colonoscopy 11/10/21 due to retained stool  Brother with colon polyps. No other known family history of colon cancer or polyps. No family history of uterine/endometrial cancer, pancreatic cancer or gastric/stomach cancer.   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     acetaminophen (TYLENOL) 650 MG CR tablet Take by mouth daily. TAKE 2 DAILY     b complex vitamins tablet Take 1 tablet by mouth daily.     Blood Glucose Monitoring Suppl (ACCU-CHEK AVIVA PLUS) w/Device KIT See admin instructions.     busPIRone (BUSPAR) 10 MG tablet 2 (two) times daily.     Calcium Carb-Cholecalciferol (CALCIUM 600 + D PO) Take 1 tablet by mouth daily.      cholecalciferol (VITAMIN D) 1000 UNITS tablet Take 1,000 Units by mouth daily.     DULoxetine (CYMBALTA) 30 MG capsule daily.     DULoxetine (CYMBALTA) 60 MG capsule Take 60 mg by mouth daily.     fluticasone (VERAMYST) 27.5 MCG/SPRAY nasal spray Place into the nose daily.     folic acid (FOLVITE) 163 MCG tablet Take 800 mcg by mouth daily.     glucose blood (ACCU-CHEK AVIVA PLUS) test strip See admin instructions.     levothyroxine (SYNTHROID, LEVOTHROID) 75 MCG tablet Take 75 mcg by mouth daily before breakfast.     metFORMIN (GLUCOPHAGE-XR) 500 MG 24 hr tablet SMARTSIG:1 Tablet(s) By Mouth Every Evening     Multiple Vitamin (MULTIVITAMIN) tablet Take 1 tablet by mouth daily.     omeprazole (PRILOSEC) 20 MG capsule Take 1 tablet by mouth daily.     Potassium 99 MG TABS 90 mg daily.     rosuvastatin (CRESTOR) 20 MG tablet Take 20 mg by mouth daily.  sodium fluoride (PREVIDENT 5000 PLUS) 1.1 % CREA dental cream      trandolapril (MAVIK) 4 MG tablet Take 0.5 tablets by mouth daily.  0   triamterene-hydrochlorothiazide (MAXZIDE) 75-50 MG per tablet Take 0.5 tablets by mouth daily.     verapamil (CALAN-SR) 240 MG CR tablet Take 240 mg by mouth daily.     Verapamil HCl CR 300 MG CP24 Take 1 capsule (300 mg total) by mouth daily. Please schedule appointment for refills 15 capsule 0   ALPRAZolam (XANAX) 0.25 MG tablet Take 0.25 mg by mouth 3 (three) times daily as needed for anxiety.      cetirizine-pseudoephedrine (ZYRTEC-D) 5-120 MG per tablet Take 1 tablet by mouth daily as needed for allergies.      glucosamine-chondroitin 500-400 MG tablet Take 2 tablets by mouth daily.     ibuprofen (ADVIL,MOTRIN) 600 MG tablet Take 1 tablet (600 mg total) by mouth every 6 (six) hours as needed. 30 tablet 0   magnesium gluconate (MAGONATE) 500 MG tablet Take 400 mg by mouth in the morning and at bedtime.     Microlet Lancets MISC See admin instructions. (Patient not taking: Reported on 10/11/2021)     vitamin C (ASCORBIC ACID) 500 MG tablet Take 500 mg by mouth daily. (Patient not taking: Reported on 10/11/2021)     Vitamin D, Cholecalciferol, 25 MCG (1000 UT) CAPS 1 capsule (Patient not taking: Reported on 10/11/2021)     Current Facility-Administered Medications  Medication Dose Route Frequency Provider Last Rate Last Admin   0.9 %  sodium chloride infusion  500 mL Intravenous Once Thornton Park, MD        Allergies as of 12/01/2021 - Review Complete 12/01/2021  Allergen Reaction Noted   Hyoscyamine sulfate Other (See Comments) 02/01/2015   Penicillins Hives and Swelling 11/28/2012    Family History  Problem Relation Age of Onset   Dementia Mother    Breast cancer Mother 53   Dementia Father    Arthritis Father    Breast cancer Sister 46   Breast cancer Maternal Aunt 68   Colon cancer Neg Hx    Colon polyps Neg Hx    Crohn's disease Neg Hx    Esophageal cancer Neg Hx    Rectal cancer Neg Hx    Stomach cancer Neg Hx    Ulcerative colitis Neg Hx      Physical Exam: General:   Alert,  well-nourished, pleasant and cooperative in NAD Head:  Normocephalic and atraumatic. Eyes:  Sclera clear, no icterus.   Conjunctiva pink. Mouth:  No deformity or lesions.   Neck:  Supple; no masses or thyromegaly. Lungs:  Clear throughout to auscultation.   No wheezes. Heart:  Regular rate and rhythm; no murmurs. Abdomen:  Soft, non-tender, nondistended, normal bowel sounds, no rebound or guarding.  Msk:  Symmetrical. No boney deformities LAD: No inguinal or umbilical LAD Extremities:  No clubbing  or edema. Neurologic:  Alert and  oriented x4;  grossly nonfocal Skin:  No obvious rash or bruise. Psych:  Alert and cooperative. Normal mood and affect.     Studies/Results: No results found.    Jaymond Waage L. Tarri Glenn, MD, MPH 12/01/2021, 2:59 PM

## 2021-12-04 ENCOUNTER — Telehealth: Payer: Self-pay | Admitting: *Deleted

## 2021-12-04 NOTE — Telephone Encounter (Signed)
  Follow up Call-     12/01/2021    2:17 PM 11/10/2021    8:17 AM  Call back number  Post procedure Call Back phone  # [phone]  Permission to leave phone message Yes Yes     Patient questions:  Do you have a fever, pain , or abdominal swelling? No. Pain Score  0 *  Have you tolerated food without any problems? Yes.    Have you been able to return to your normal activities? Yes.    Do you have any questions about your discharge instructions: Diet   No. Medications  No. Follow up visit  No.  Do you have questions or concerns about your Care? No.  Actions: * If pain score is 4 or above: No action needed, pain <4.

## 2022-02-02 ENCOUNTER — Other Ambulatory Visit (HOSPITAL_COMMUNITY)
Admission: RE | Admit: 2022-02-02 | Discharge: 2022-02-02 | Disposition: A | Discharge status: Home / Self Care | Payer: BC Managed Care – PPO | Source: Ambulatory Visit | Attending: Nurse Practitioner | Admitting: Nurse Practitioner

## 2022-02-02 ENCOUNTER — Other Ambulatory Visit: Payer: Self-pay | Admitting: Nurse Practitioner

## 2022-02-02 DIAGNOSIS — Z124 Encounter for screening for malignant neoplasm of cervix: Secondary | ICD-10-CM | POA: Diagnosis present

## 2022-02-08 LAB — CYTOLOGY - PAP
Comment: NEGATIVE
Diagnosis: NEGATIVE
High risk HPV: NEGATIVE

## 2022-03-02 ENCOUNTER — Encounter: Payer: Self-pay | Admitting: Gastroenterology

## 2022-05-29 NOTE — Progress Notes (Signed)
[facility] - Clinic Note  06/01/2022     CHIEF COMPLAINT Patient presents for Retina Follow Up    HISTORY OF PRESENT ILLNESS: Debbie Cochran is a 67 y.o. female who presents to the clinic today for:   HPI     Retina Follow Up   Patient presents with  Other (VMT with ERM).  In left eye.  Severity is moderate.  Duration of 12 months.  Since onset it is stable.  I, the attending physician,  performed the HPI with the patient and updated documentation appropriately.        Comments   Patient states vision the same OU. BS was 129 this am. Last A1c was less than 7, checked in January 2024.       Last edited by Rennis Chris, MD on 06/03/2022 12:25 AM.    Patient feels that the vision is holding steady and has not noticed any changes.   Referring physician: Shon Millet, MD [id number] [facility] [place],  [place] [address]  HISTORICAL INFORMATION:   Selected notes from the MEDICAL RECORD NUMBER Referred by Dr. Sherrine Maples for VMT OS LEE:  Ocular Hx- PMH-    CURRENT MEDICATIONS: No current outpatient medications on file. (Ophthalmic Drugs)   No current facility-administered medications for this visit. (Ophthalmic Drugs)   Current Outpatient Medications (Other)  Medication Sig   acetaminophen (TYLENOL) 650 MG CR tablet Take 1,300 mg by mouth daily.   acetaminophen (TYLENOL) 650 MG CR tablet Take by mouth daily. TAKE 2 DAILY   ALPRAZolam (XANAX) 0.25 MG tablet Take 0.25 mg by mouth 3 (three) times daily as needed for anxiety.    b complex vitamins tablet Take 1 tablet by mouth daily.   Blood Glucose Monitoring Suppl (ACCU-CHEK AVIVA PLUS) w/Device KIT See admin instructions.   busPIRone (BUSPAR) 10 MG tablet 2 (two) times daily.   Calcium Carb-Cholecalciferol (CALCIUM 600 + D PO) Take 1 tablet by mouth daily.    cetirizine-pseudoephedrine (ZYRTEC-D) 5-120 MG per tablet Take 1 tablet by mouth daily as needed for allergies.   cholecalciferol (VITAMIN D) 1000  UNITS tablet Take 1,000 Units by mouth daily.   DULoxetine (CYMBALTA) 30 MG capsule daily.   DULoxetine (CYMBALTA) 60 MG capsule Take 60 mg by mouth daily.   fluticasone (VERAMYST) 27.5 MCG/SPRAY nasal spray Place into the nose daily.   folic acid (FOLVITE) 400 MCG tablet Take 800 mcg by mouth daily.   glucosamine-chondroitin 500-400 MG tablet Take 2 tablets by mouth daily.   glucose blood (ACCU-CHEK AVIVA PLUS) test strip See admin instructions.   ibuprofen (ADVIL,MOTRIN) 600 MG tablet Take 1 tablet (600 mg total) by mouth every 6 (six) hours as needed.   levothyroxine (SYNTHROID, LEVOTHROID) 75 MCG tablet Take 75 mcg by mouth daily before breakfast.   magnesium gluconate (MAGONATE) 500 MG tablet Take 400 mg by mouth in the morning and at bedtime.   metFORMIN (GLUCOPHAGE-XR) 500 MG 24 hr tablet SMARTSIG:1 Tablet(s) By Mouth Every Evening   Multiple Vitamin (MULTIVITAMIN) tablet Take 1 tablet by mouth daily.   omeprazole (PRILOSEC) 20 MG capsule Take 1 tablet by mouth daily.   Potassium 99 MG TABS 90 mg daily.   rosuvastatin (CRESTOR) 20 MG tablet Take 20 mg by mouth daily.   sodium fluoride (PREVIDENT 5000 PLUS) 1.1 % CREA dental cream    trandolapril (MAVIK) 4 MG tablet Take 0.5 tablets by mouth daily.   triamterene-hydrochlorothiazide (MAXZIDE) 75-50 MG per tablet Take 0.5 tablets  by mouth daily.   verapamil (CALAN-SR) 240 MG CR tablet Take 240 mg by mouth daily.   Verapamil HCl CR 300 MG CP24 Take 1 capsule (300 mg total) by mouth daily. Please schedule appointment for refills   Microlet Lancets MISC See admin instructions. (Patient not taking: Reported on 10/11/2021)   vitamin C (ASCORBIC ACID) 500 MG tablet Take 500 mg by mouth daily. (Patient not taking: Reported on 10/11/2021)   Vitamin D, Cholecalciferol, 25 MCG (1000 UT) CAPS 1 capsule (Patient not taking: Reported on 10/11/2021)   Current Facility-Administered Medications (Other)  Medication Route   0.9 %  sodium chloride infusion  Intravenous   REVIEW OF SYSTEMS: ROS   Positive for: Gastrointestinal, Neurological, Endocrine, Cardiovascular, Eyes Negative for: Constitutional, Skin, Genitourinary, Musculoskeletal, HENT, Respiratory, Psychiatric, Allergic/Imm, Heme/Lymph Last edited by Doreene Nest, COT on 06/01/2022  8:13 AM.     ALLERGIES Allergies  Allergen Reactions   Hyoscyamine Sulfate Other (See Comments)    Other Reaction: Other reaction   Penicillins Hives and Swelling    Has patient had a PCN reaction causing immediate rash, facial/tongue/throat swelling, SOB or lightheadedness with hypotension: Yes Has patient had a PCN reaction causing severe rash involving mucus membranes or skin necrosis: No Has patient had a PCN reaction that required hospitalization No Has patient had a PCN reaction occurring within the last 10 years: No If all of the above answers are "NO", then may proceed with Cephalosporin use.    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   HYSTEROSCOPY WITH D & C N/A 02/14/2015   Procedure: DILATATION AND CURETTAGE /HYSTEROSCOPY with MYOSURE;  Surgeon: Geryl Rankins, MD;  Location: [facility];  Service: Gynecology;  Laterality: N/A;   POLYPECTOMY     TONSILLECTOMY     FAMILY HISTORY Family History  Problem Relation Age of Onset   Dementia Mother    Breast cancer Mother 66   Dementia  Father    Arthritis Father    Breast cancer Sister 74   Breast cancer Maternal Aunt 68   Colon cancer Neg Hx    Colon polyps Neg Hx    Crohn's disease Neg Hx    Esophageal cancer Neg Hx    Rectal cancer Neg Hx    Stomach cancer Neg Hx    Ulcerative colitis Neg Hx    SOCIAL HISTORY Social History   Tobacco Use   Smoking status: Former    Packs/day: 0.01    Years: 35.00    Additional pack years: 0.00    Total pack years: 0.35    Types: Cigarettes    Quit date: 06/15/2013    Years since quitting: 8.9    Passive exposure: Never   Smokeless tobacco: Never  Vaping Use  Vaping Use: Never used  Substance Use Topics   Alcohol use: Yes    Comment: once in a while not daily   Drug use: No       OPHTHALMIC EXAM: Base Eye Exam     Visual Acuity (Snellen - Linear)       Right Left   Dist cc 20/25 +2 20/20 -2   Dist ph cc 20/20 -2     Correction: Glasses         Tonometry (Tonopen, 8:24 AM)       Right Left   Pressure 16 18         Pupils       Dark Light Shape React APD   Right 3 2 Round Brisk None   Left 3 2 Round Brisk None         Visual Fields (Counting fingers)       Left Right    Full Full         Extraocular Movement       Right Left    Full, Ortho Full, Ortho         Neuro/Psych     Oriented x3: Yes   Mood/Affect: Normal         Dilation     Both eyes: 1.0% Mydriacyl, 2.5% Phenylephrine @ 8:23 AM           Slit Lamp and Fundus Exam     Slit Lamp Exam       Right Left   Lids/Lashes Dermatochalasis - upper lid, mild MGD, Meibomian gland dysfunction Dermatochalasis - upper lid, mild MGD, Meibomian gland dysfunction   Conjunctiva/Sclera White and quiet White and quiet   Cornea mild arcus, Debris in tear film mild arcus, , Debris in tear film   Anterior Chamber Deep and quiet Deep and quiet   Iris Round and dilated Round and dilated   Lens 2+ Cortical cataract, 2+ Nuclear sclerosis 2+ Cortical cataract, 2+ Nuclear  sclerosis   Anterior Vitreous Vitreous syneresis Vitreous syneresis, no pigment, Posterior vitreous detachment, prominent vitreous condensations         Fundus Exam       Right Left   Disc Pink and Sharp, mild PPA Pink and Sharp, mild PPP   C/D Ratio 0.6 0.6   Macula Flat, Good foveal reflex, RPE mottling, No heme or edema Flat, Blunted foveal reflex, ERM with trace central cystic changes--improved   Vessels attenuated, Tortuous attenuated, Tortuous, mild Copper wiring   Periphery Attached, no heme, mild paving stone degeneration inferiorly, No RT/RD Attached, VR tuft at 0430, No heme, mild reticular degeneration, No RT/RD           Refraction     Wearing Rx       Sphere Cylinder Axis Add   Right +0.00 +0.25 180 +2.50   Left -0.25 +0.25 180 +2.50           IMAGING AND PROCEDURES  Imaging and Procedures for 06/01/2022  OCT, Retina - OU - Both Eyes       Right Eye Quality was good. Central Foveal Thickness: 316. Progression has been stable. Findings include normal foveal contour, no IRF, no SRF, vitreomacular adhesion .   Left Eye Quality was good. Central Foveal Thickness: 322. Progression has improved. 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 CME - cystoid macular edema. PED - pigment epithelial detachment. IRF - intraretinal fluid. SRF - subretinal fluid. EZ - ellipsoid zone. ERM - epiretinal membrane. ORA - outer retinal atrophy. ORT - outer retinal tubulation. SRHM - subretinal hyper-reflective  material. IRHM - intraretinal hyper-reflective material            ASSESSMENT/PLAN:    ICD-10-CM   1. Epiretinal membrane (ERM) of left eye  H35.372 OCT, Retina - OU - Both Eyes    2. Vitreomacular traction syndrome, left  H43.822     3. Macular cyst, hole, or pseudohole, left eye  H35.342     4. Posterior vitreous detachment of left eye  H43.812     5. Diabetes mellitus type 2 without retinopathy (HCC)  E11.9     6. Long term (current) use of oral hypoglycemic drugs  Z79.84     7. Essential hypertension  I10     8. Hypertensive retinopathy of both eyes  H35.033     9. Combined forms of age-related cataract of both eyes  H25.813      1-3. 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 PVD / vitreous syneresis OS - onset of symptomatic floaters and intermittent flashes ~early Nov 2022 -- improved, but pt reports persistent floater / smudge in vision OS  - Discussed findings and prognosis  - No RT or RD on 360 scleral depressed exam  - Reviewed s/s of RT/RD  - Strict return precautions for any such RT/RD symptoms  5,6. Diabetes mellitus, type 2 without retinopathy - The incidence, risk factors for progression, natural history and treatment options for diabetic retinopathy  were discussed with patient.   - The need for close monitoring of blood glucose, blood pressure, and serum lipids, avoiding cigarette or any type of tobacco, and the need for long term follow up was also discussed with patient. - f/u in 1 year, sooner prn  7,8. Hypertensive retinopathy OU - discussed importance of tight BP  control - monitor  9. Mixed Cataract OU - The symptoms of cataract, surgical options, and treatments and risks were discussed with patient. - discussed diagnosis and progression - Under the expert management of Dr. Sherrine Maples   Ophthalmic Meds Ordered this visit:  No orders of the defined types were placed in this encounter.    Return in about 1 year (around 06/01/2023) for f/u ERM OS DFE, OCT.  There are no Patient Instructions on file for this visit.  This document serves as a record of services personally performed by Karie Chimera, MD, PhD. It was created on their behalf by Berlin Hun COT, an ophthalmic technician. The creation of this record is the provider's dictation and/or activities during the visit.    Electronically signed by: Berlin Hun COT 05.13.2024 12:28 AM  This  document serves as a record of services personally performed by Karie Chimera, MD, PhD. It was created on their behalf by Gerilyn Nestle, COT an ophthalmic technician. The creation of this record is the provider's dictation and/or activities during the visit.    Electronically signed by:  Gerilyn Nestle, COT  5.17.24 12:28 AM  Karie Chimera, M.D., Ph.D. Diseases & Surgery of the Retina and Vitreous [facility] 06/01/2022   I have reviewed the above documentation for accuracy and completeness, and I agree with the above. Karie Chimera, M.D., Ph.D. 06/03/22 12:28 AM  Abbreviations: M myopia (nearsighted); A astigmatism; H hyperopia (farsighted); P presbyopia; Mrx spectacle prescription;  CTL contact lenses; OD right eye; OS left eye; OU both eyes  XT exotropia; ET esotropia; PEK punctate epithelial keratitis; PEE punctate epithelial erosions; DES dry eye syndrome; MGD meibomian gland dysfunction; ATs artificial tears; PFAT's preservative free artificial tears; NSC nuclear sclerotic cataract; PSC posterior subcapsular cataract; ERM epi-retinal membrane; PVD  posterior vitreous detachment; RD retinal detachment; DM diabetes mellitus; DR diabetic retinopathy; NPDR non-proliferative diabetic retinopathy; PDR proliferative diabetic retinopathy; CSME clinically significant macular edema; DME diabetic macular edema; dbh dot blot hemorrhages; CWS cotton wool spot; POAG primary open angle glaucoma; C/D cup-to-disc ratio; HVF humphrey visual field; GVF goldmann visual field; OCT optical coherence tomography; IOP intraocular pressure; BRVO Branch retinal vein occlusion; CRVO central retinal vein occlusion; CRAO central retinal artery occlusion; BRAO branch retinal artery occlusion; RT retinal tear; SB scleral buckle; PPV pars plana vitrectomy; VH Vitreous hemorrhage; PRP panretinal laser photocoagulation; IVK intravitreal kenalog; VMT vitreomacular traction; MH Macular hole;  NVD neovascularization of the disc; NVE neovascularization elsewhere; AREDS age related eye disease study; ARMD age related macular degeneration; POAG primary open angle glaucoma; EBMD epithelial/anterior basement membrane dystrophy; ACIOL anterior chamber intraocular lens; IOL intraocular lens; PCIOL posterior chamber intraocular lens; Phaco/IOL phacoemulsification with intraocular lens placement; PRK photorefractive keratectomy; LASIK laser assisted in situ keratomileusis; HTN hypertension; DM diabetes mellitus; COPD chronic obstructive pulmonary disease

## 2022-06-01 ENCOUNTER — Ambulatory Visit (INDEPENDENT_AMBULATORY_CARE_PROVIDER_SITE_OTHER): Payer: BC Managed Care – PPO | Admitting: Ophthalmology

## 2022-06-01 ENCOUNTER — Encounter (INDEPENDENT_AMBULATORY_CARE_PROVIDER_SITE_OTHER): Payer: Self-pay | Admitting: Ophthalmology

## 2022-06-01 DIAGNOSIS — H35033 Hypertensive retinopathy, bilateral: Secondary | ICD-10-CM

## 2022-06-01 DIAGNOSIS — H35372 Puckering of macula, left eye: Secondary | ICD-10-CM | POA: Diagnosis not present

## 2022-06-01 DIAGNOSIS — H25813 Combined forms of age-related cataract, bilateral: Secondary | ICD-10-CM

## 2022-06-01 DIAGNOSIS — Z7984 Long term (current) use of oral hypoglycemic drugs: Secondary | ICD-10-CM

## 2022-06-01 DIAGNOSIS — H35342 Macular cyst, hole, or pseudohole, left eye: Secondary | ICD-10-CM

## 2022-06-01 DIAGNOSIS — H43812 Vitreous degeneration, left eye: Secondary | ICD-10-CM

## 2022-06-01 DIAGNOSIS — H43822 Vitreomacular adhesion, left eye: Secondary | ICD-10-CM | POA: Diagnosis not present

## 2022-06-01 DIAGNOSIS — I10 Essential (primary) hypertension: Secondary | ICD-10-CM

## 2022-06-01 DIAGNOSIS — E119 Type 2 diabetes mellitus without complications: Secondary | ICD-10-CM

## 2022-06-03 ENCOUNTER — Encounter (INDEPENDENT_AMBULATORY_CARE_PROVIDER_SITE_OTHER): Payer: Self-pay | Admitting: Ophthalmology

## 2022-08-08 ENCOUNTER — Other Ambulatory Visit: Payer: Self-pay | Admitting: Family Medicine

## 2022-08-08 DIAGNOSIS — Z1231 Encounter for screening mammogram for malignant neoplasm of breast: Secondary | ICD-10-CM

## 2022-08-09 ENCOUNTER — Other Ambulatory Visit: Payer: Self-pay | Admitting: Family Medicine

## 2022-08-09 DIAGNOSIS — E2839 Other primary ovarian failure: Secondary | ICD-10-CM

## 2022-08-10 ENCOUNTER — Ambulatory Visit
Admission: RE | Admit: 2022-08-10 | Discharge: 2022-08-10 | Disposition: A | Discharge status: Home / Self Care | Payer: BC Managed Care – PPO | Source: Ambulatory Visit | Attending: Family Medicine | Admitting: Family Medicine

## 2022-08-10 DIAGNOSIS — Z1231 Encounter for screening mammogram for malignant neoplasm of breast: Secondary | ICD-10-CM

## 2022-08-22 ENCOUNTER — Encounter (INDEPENDENT_AMBULATORY_CARE_PROVIDER_SITE_OTHER): Payer: BC Managed Care – PPO | Admitting: Ophthalmology

## 2022-09-20 DIAGNOSIS — M542 Cervicalgia: Secondary | ICD-10-CM | POA: Diagnosis not present

## 2022-10-09 DIAGNOSIS — Z9884 Bariatric surgery status: Secondary | ICD-10-CM | POA: Diagnosis not present

## 2022-10-09 DIAGNOSIS — I10 Essential (primary) hypertension: Secondary | ICD-10-CM | POA: Diagnosis not present

## 2022-10-09 DIAGNOSIS — E782 Mixed hyperlipidemia: Secondary | ICD-10-CM | POA: Diagnosis not present

## 2022-10-09 DIAGNOSIS — E559 Vitamin D deficiency, unspecified: Secondary | ICD-10-CM | POA: Diagnosis not present

## 2022-10-24 DIAGNOSIS — G4719 Other hypersomnia: Secondary | ICD-10-CM | POA: Diagnosis not present

## 2022-10-24 DIAGNOSIS — I10 Essential (primary) hypertension: Secondary | ICD-10-CM | POA: Diagnosis not present

## 2022-10-24 DIAGNOSIS — Z6841 Body Mass Index (BMI) 40.0 and over, adult: Secondary | ICD-10-CM | POA: Diagnosis not present

## 2022-11-06 DIAGNOSIS — I10 Essential (primary) hypertension: Secondary | ICD-10-CM | POA: Diagnosis not present

## 2022-11-06 DIAGNOSIS — D508 Other iron deficiency anemias: Secondary | ICD-10-CM | POA: Diagnosis not present

## 2022-11-06 DIAGNOSIS — F418 Other specified anxiety disorders: Secondary | ICD-10-CM | POA: Diagnosis not present

## 2022-11-06 DIAGNOSIS — Z9884 Bariatric surgery status: Secondary | ICD-10-CM | POA: Diagnosis not present

## 2022-11-06 DIAGNOSIS — Z6841 Body Mass Index (BMI) 40.0 and over, adult: Secondary | ICD-10-CM | POA: Diagnosis not present

## 2022-11-06 DIAGNOSIS — K5900 Constipation, unspecified: Secondary | ICD-10-CM | POA: Diagnosis not present

## 2022-11-06 DIAGNOSIS — E559 Vitamin D deficiency, unspecified: Secondary | ICD-10-CM | POA: Diagnosis not present

## 2022-11-06 DIAGNOSIS — E782 Mixed hyperlipidemia: Secondary | ICD-10-CM | POA: Diagnosis not present

## 2022-11-06 DIAGNOSIS — E1159 Type 2 diabetes mellitus with other circulatory complications: Secondary | ICD-10-CM | POA: Diagnosis not present

## 2022-11-06 DIAGNOSIS — E039 Hypothyroidism, unspecified: Secondary | ICD-10-CM | POA: Diagnosis not present

## 2022-11-06 DIAGNOSIS — K219 Gastro-esophageal reflux disease without esophagitis: Secondary | ICD-10-CM | POA: Diagnosis not present

## 2022-11-08 DIAGNOSIS — R69 Illness, unspecified: Secondary | ICD-10-CM | POA: Diagnosis not present

## 2022-11-19 DIAGNOSIS — D508 Other iron deficiency anemias: Secondary | ICD-10-CM | POA: Diagnosis not present

## 2022-11-26 IMAGING — MG MM DIGITAL SCREENING BILAT W/ TOMO AND CAD
8 series · 8 of 24 positions shown · non-contrast
Comparison: Previous exam(s).

CLINICAL DATA: Screening.

EXAM:
DIGITAL SCREENING BILATERAL MAMMOGRAM WITH TOMOSYNTHESIS AND CAD
TECHNIQUE: Bilateral screening digital craniocaudal and mediolateral oblique
mammograms were obtained. Bilateral screening digital breast
tomosynthesis was performed. The images were evaluated with
computer-aided detection.

[L CC synth-2D]
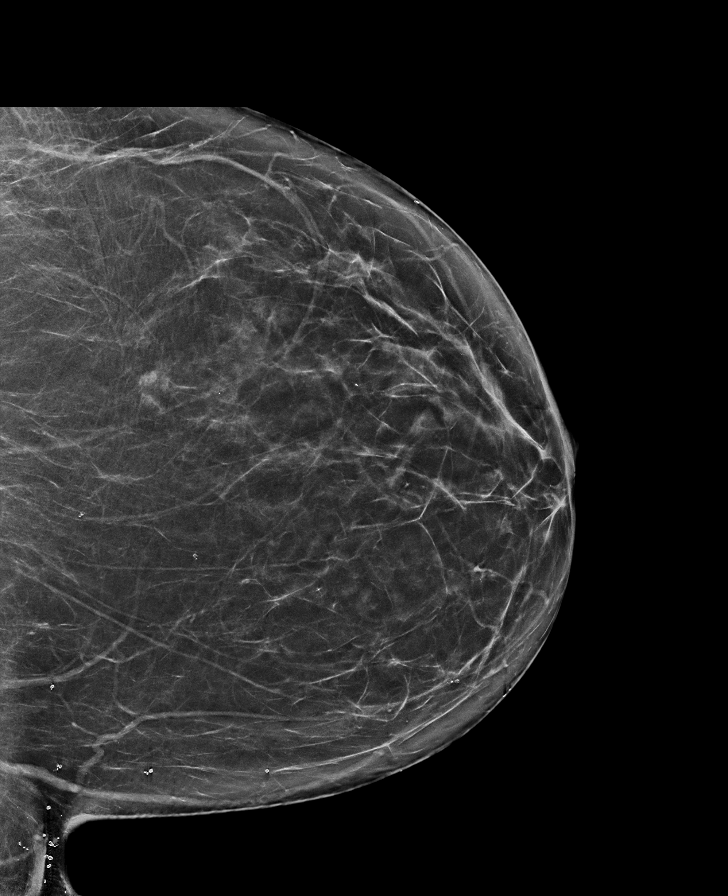

[L MLO synth-2D]
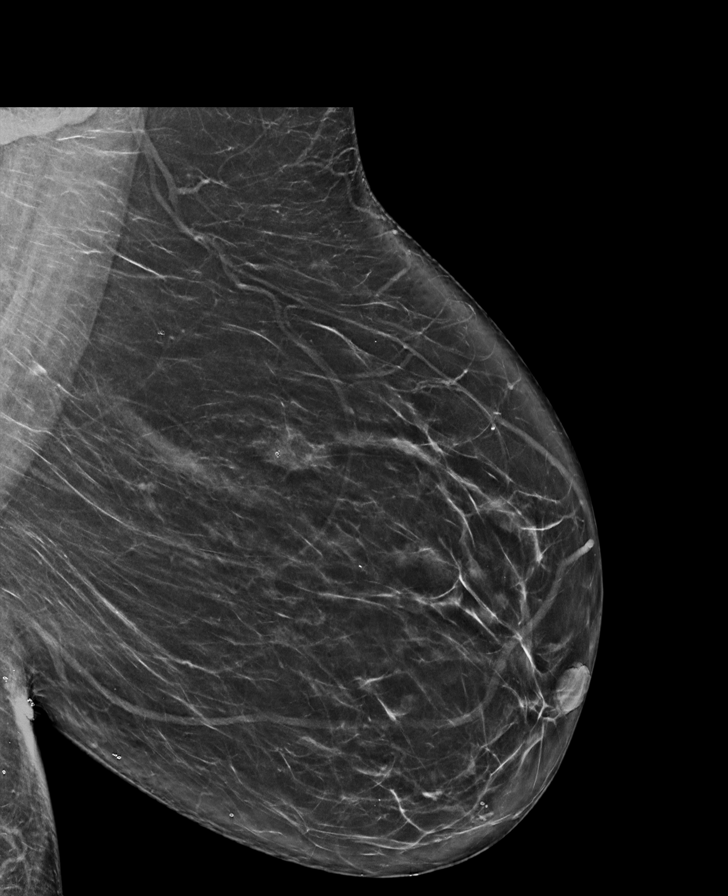

[R CC synth-2D]
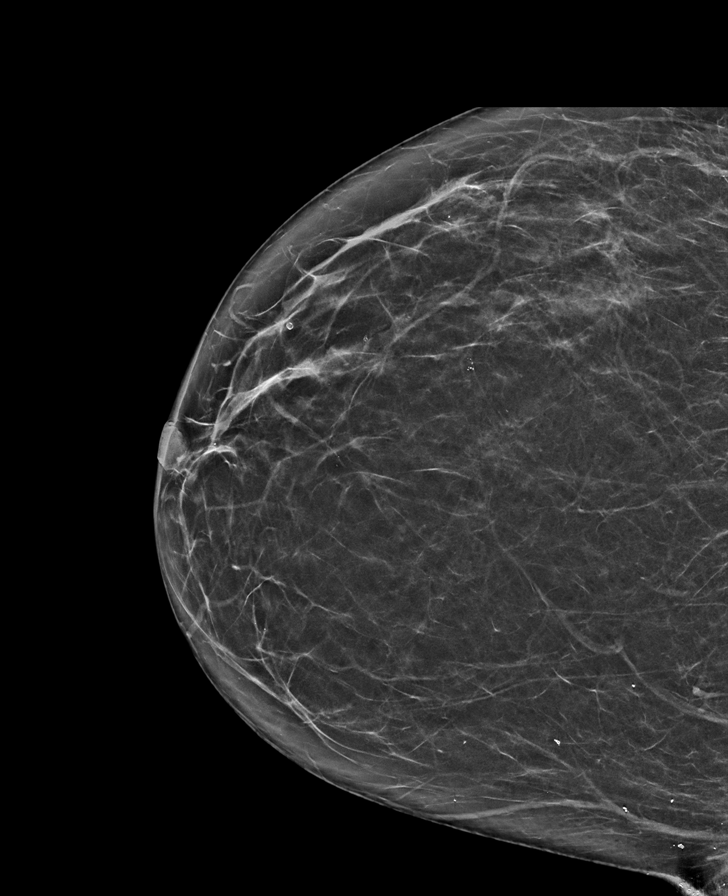

[R MLO synth-2D]
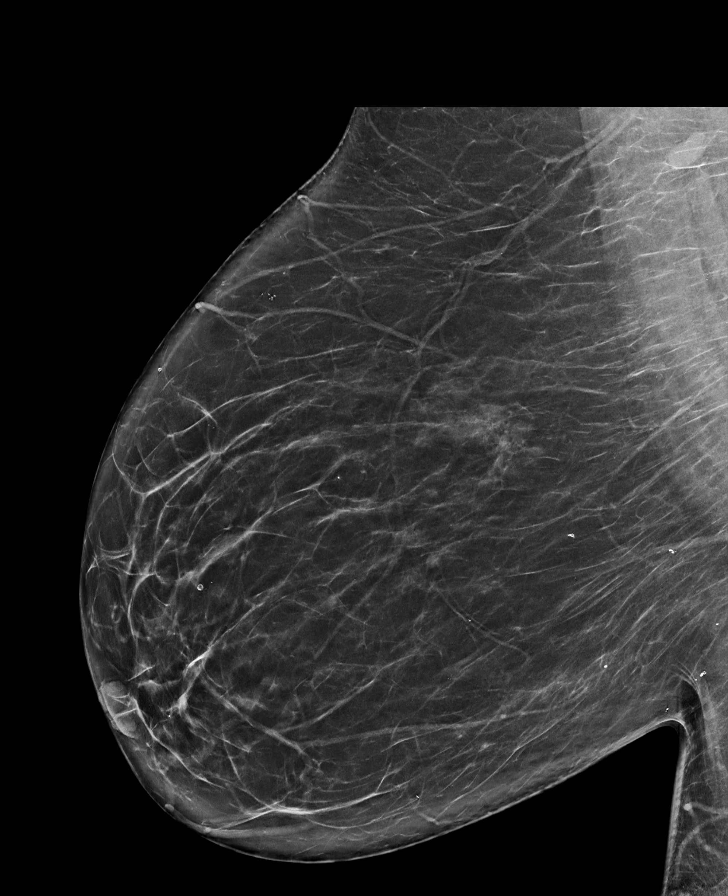

[R CC tomo · tomo slice 35/70.0]
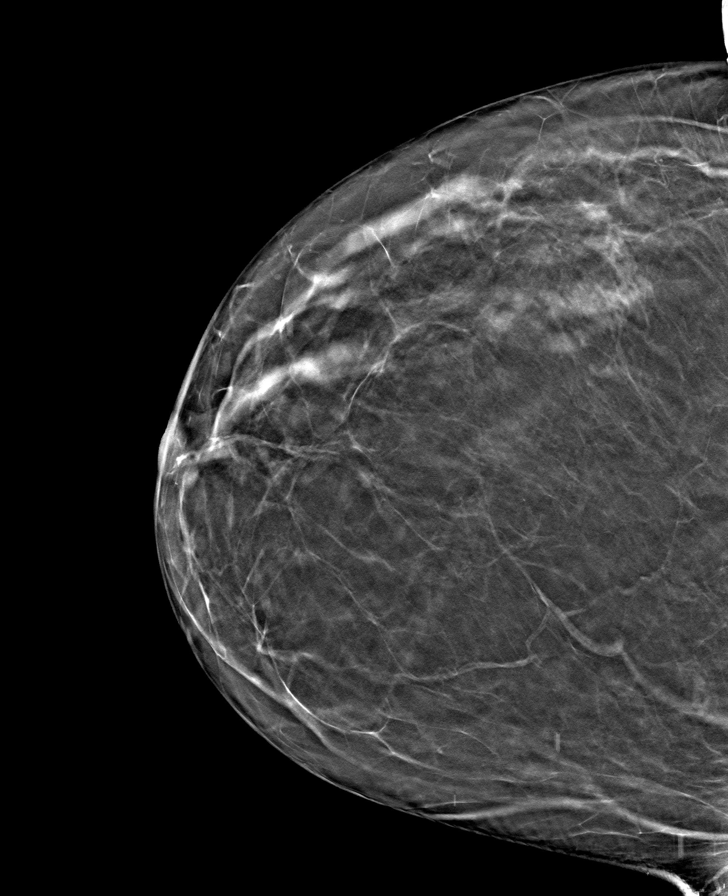

[L MLO tomo · tomo slice 41/80.0]
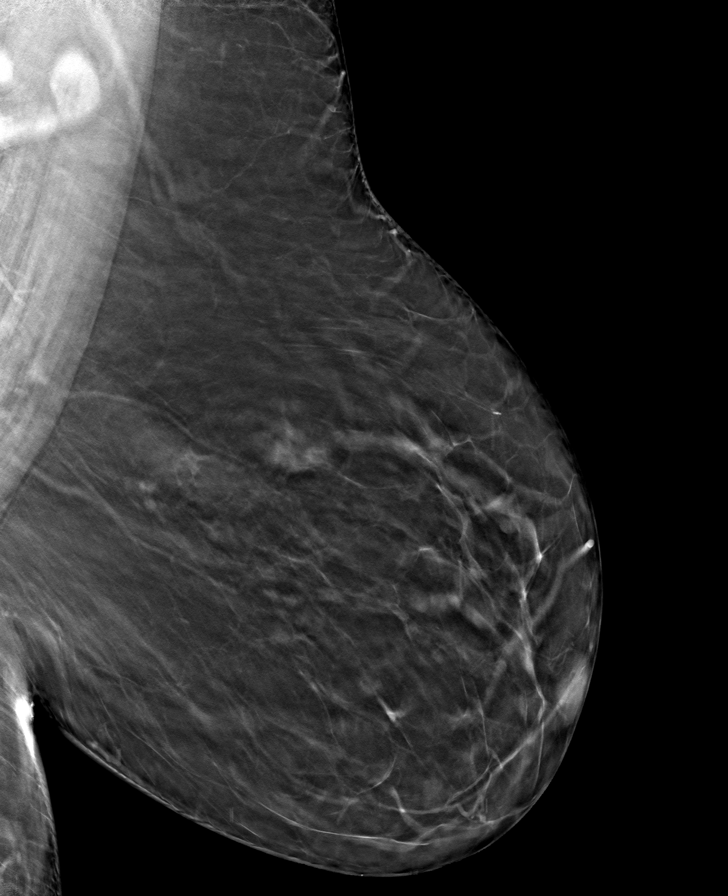

[R MLO tomo · tomo slice 41/82.0]
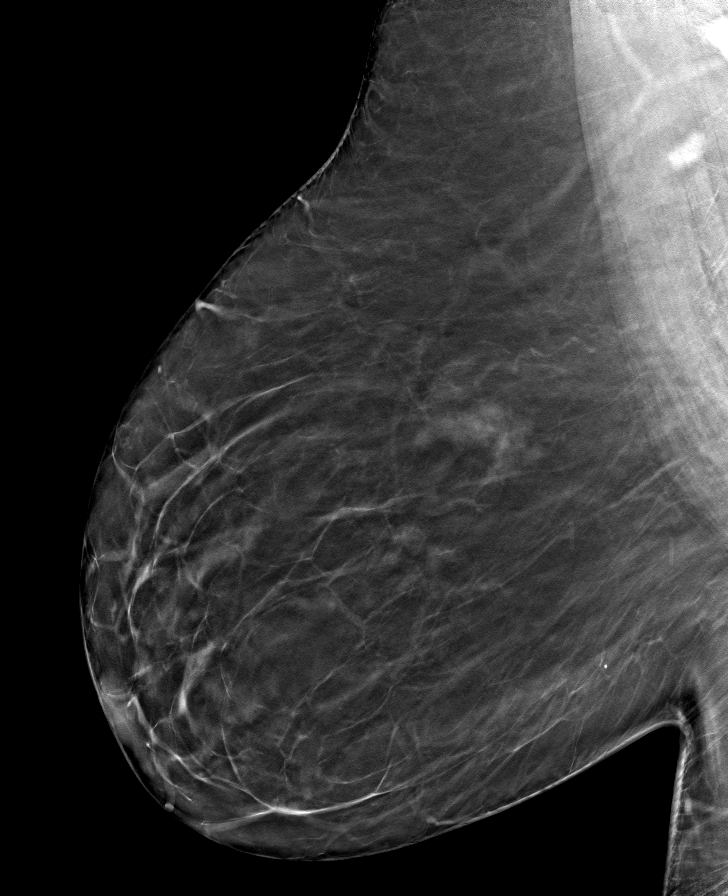

[L CC tomo · tomo slice 37/74.0]
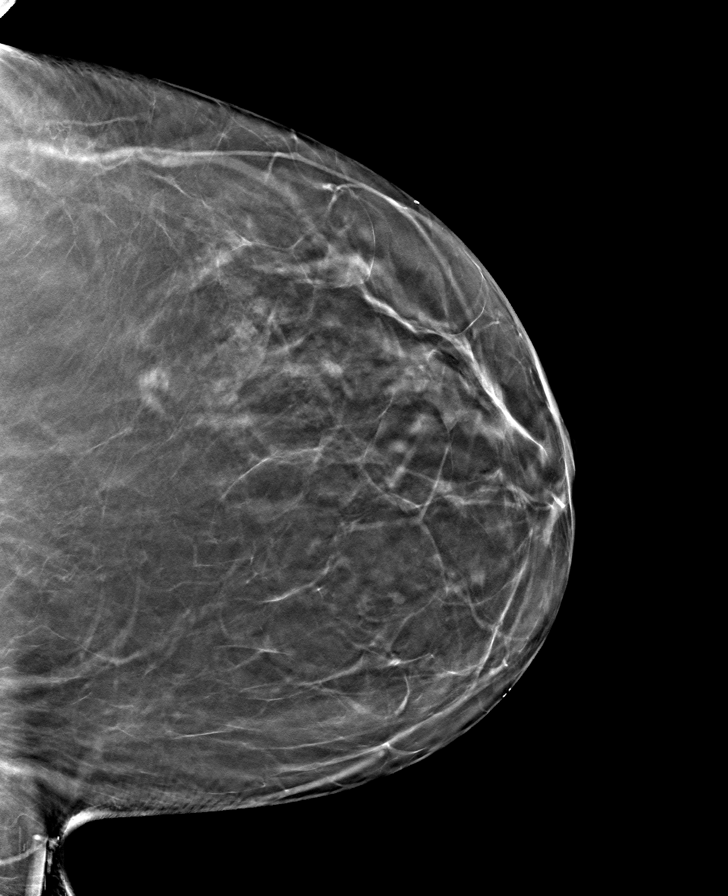

[8 of 24 positions shown; findings below may reference images not displayed]

ACR Breast Density Category b: There are scattered areas of
fibroglandular density.
FINDINGS: There are no findings suspicious for malignancy.
IMPRESSION: No mammographic evidence of malignancy. A result letter of this
screening mammogram will be mailed directly to the patient.

RECOMMENDATION:
Screening mammogram in one year. (Code:51-O-LD2)

BI-RADS CATEGORY  1: Negative.

## 2022-12-12 DIAGNOSIS — B9689 Other specified bacterial agents as the cause of diseases classified elsewhere: Secondary | ICD-10-CM | POA: Diagnosis not present

## 2022-12-12 DIAGNOSIS — Z6841 Body Mass Index (BMI) 40.0 and over, adult: Secondary | ICD-10-CM | POA: Diagnosis not present

## 2022-12-12 DIAGNOSIS — J208 Acute bronchitis due to other specified organisms: Secondary | ICD-10-CM | POA: Diagnosis not present

## 2022-12-20 DIAGNOSIS — D508 Other iron deficiency anemias: Secondary | ICD-10-CM | POA: Diagnosis not present

## 2023-02-06 DIAGNOSIS — F411 Generalized anxiety disorder: Secondary | ICD-10-CM | POA: Diagnosis not present

## 2023-02-06 DIAGNOSIS — F331 Major depressive disorder, recurrent, moderate: Secondary | ICD-10-CM | POA: Diagnosis not present

## 2023-04-09 DIAGNOSIS — E559 Vitamin D deficiency, unspecified: Secondary | ICD-10-CM | POA: Diagnosis not present

## 2023-04-09 DIAGNOSIS — E782 Mixed hyperlipidemia: Secondary | ICD-10-CM | POA: Diagnosis not present

## 2023-04-09 DIAGNOSIS — K5900 Constipation, unspecified: Secondary | ICD-10-CM | POA: Diagnosis not present

## 2023-04-09 DIAGNOSIS — D508 Other iron deficiency anemias: Secondary | ICD-10-CM | POA: Diagnosis not present

## 2023-04-09 DIAGNOSIS — K219 Gastro-esophageal reflux disease without esophagitis: Secondary | ICD-10-CM | POA: Diagnosis not present

## 2023-04-09 DIAGNOSIS — I10 Essential (primary) hypertension: Secondary | ICD-10-CM | POA: Diagnosis not present

## 2023-04-09 DIAGNOSIS — F418 Other specified anxiety disorders: Secondary | ICD-10-CM | POA: Diagnosis not present

## 2023-04-09 DIAGNOSIS — E039 Hypothyroidism, unspecified: Secondary | ICD-10-CM | POA: Diagnosis not present

## 2023-04-09 DIAGNOSIS — Z9884 Bariatric surgery status: Secondary | ICD-10-CM | POA: Diagnosis not present

## 2023-04-09 DIAGNOSIS — E1159 Type 2 diabetes mellitus with other circulatory complications: Secondary | ICD-10-CM | POA: Diagnosis not present

## 2023-04-09 DIAGNOSIS — Z6837 Body mass index (BMI) 37.0-37.9, adult: Secondary | ICD-10-CM | POA: Diagnosis not present

## 2023-04-11 ENCOUNTER — Ambulatory Visit
Admission: RE | Admit: 2023-04-11 | Discharge: 2023-04-11 | Disposition: A | Discharge status: Home / Self Care | Payer: BC Managed Care – PPO | Source: Ambulatory Visit | Attending: Family Medicine | Admitting: Family Medicine

## 2023-04-11 DIAGNOSIS — E2839 Other primary ovarian failure: Secondary | ICD-10-CM | POA: Diagnosis not present

## 2023-04-11 DIAGNOSIS — N958 Other specified menopausal and perimenopausal disorders: Secondary | ICD-10-CM | POA: Diagnosis not present

## 2023-04-11 DIAGNOSIS — M8588 Other specified disorders of bone density and structure, other site: Secondary | ICD-10-CM | POA: Diagnosis not present

## 2023-04-23 DIAGNOSIS — Z01419 Encounter for gynecological examination (general) (routine) without abnormal findings: Secondary | ICD-10-CM | POA: Diagnosis not present

## 2023-05-01 DIAGNOSIS — F331 Major depressive disorder, recurrent, moderate: Secondary | ICD-10-CM | POA: Diagnosis not present

## 2023-05-01 DIAGNOSIS — F411 Generalized anxiety disorder: Secondary | ICD-10-CM | POA: Diagnosis not present

## 2023-05-09 ENCOUNTER — Other Ambulatory Visit (HOSPITAL_COMMUNITY): Payer: Self-pay

## 2023-05-09 MED ORDER — MOUNJARO 10 MG/0.5ML ~~LOC~~ SOAJ
10.0000 mg | SUBCUTANEOUS | 0 refills | Status: DC
  Filled 2023-05-09: qty 2, 28d supply, fill #0

## 2023-05-10 ENCOUNTER — Other Ambulatory Visit (HOSPITAL_COMMUNITY): Payer: Self-pay

## 2023-05-13 ENCOUNTER — Other Ambulatory Visit (HOSPITAL_COMMUNITY): Payer: Self-pay

## 2023-05-16 DIAGNOSIS — H43813 Vitreous degeneration, bilateral: Secondary | ICD-10-CM | POA: Diagnosis not present

## 2023-05-16 DIAGNOSIS — H52203 Unspecified astigmatism, bilateral: Secondary | ICD-10-CM | POA: Diagnosis not present

## 2023-05-16 DIAGNOSIS — H2513 Age-related nuclear cataract, bilateral: Secondary | ICD-10-CM | POA: Diagnosis not present

## 2023-05-16 DIAGNOSIS — H35372 Puckering of macula, left eye: Secondary | ICD-10-CM | POA: Diagnosis not present

## 2023-05-16 DIAGNOSIS — E119 Type 2 diabetes mellitus without complications: Secondary | ICD-10-CM | POA: Diagnosis not present

## 2023-05-21 DIAGNOSIS — K219 Gastro-esophageal reflux disease without esophagitis: Secondary | ICD-10-CM | POA: Diagnosis not present

## 2023-05-21 DIAGNOSIS — Z6836 Body mass index (BMI) 36.0-36.9, adult: Secondary | ICD-10-CM | POA: Diagnosis not present

## 2023-05-21 DIAGNOSIS — E1159 Type 2 diabetes mellitus with other circulatory complications: Secondary | ICD-10-CM | POA: Diagnosis not present

## 2023-05-21 DIAGNOSIS — F418 Other specified anxiety disorders: Secondary | ICD-10-CM | POA: Diagnosis not present

## 2023-05-21 DIAGNOSIS — K5900 Constipation, unspecified: Secondary | ICD-10-CM | POA: Diagnosis not present

## 2023-05-21 DIAGNOSIS — E782 Mixed hyperlipidemia: Secondary | ICD-10-CM | POA: Diagnosis not present

## 2023-05-21 DIAGNOSIS — I10 Essential (primary) hypertension: Secondary | ICD-10-CM | POA: Diagnosis not present

## 2023-05-21 DIAGNOSIS — Z9884 Bariatric surgery status: Secondary | ICD-10-CM | POA: Diagnosis not present

## 2023-05-21 DIAGNOSIS — D508 Other iron deficiency anemias: Secondary | ICD-10-CM | POA: Diagnosis not present

## 2023-05-21 DIAGNOSIS — E559 Vitamin D deficiency, unspecified: Secondary | ICD-10-CM | POA: Diagnosis not present

## 2023-05-21 DIAGNOSIS — E039 Hypothyroidism, unspecified: Secondary | ICD-10-CM | POA: Diagnosis not present

## 2023-05-22 ENCOUNTER — Other Ambulatory Visit (HOSPITAL_COMMUNITY): Payer: Self-pay

## 2023-05-22 MED ORDER — MOUNJARO 10 MG/0.5ML ~~LOC~~ SOAJ
10.0000 mg | SUBCUTANEOUS | 0 refills | Status: DC
  Filled 2023-05-22 – 2023-06-05 (×2): qty 2, 28d supply, fill #0

## 2023-05-30 NOTE — Progress Notes (Signed)
 [facility] - Clinic Note  05/31/2023     CHIEF COMPLAINT Patient presents for Retina Follow Up    HISTORY OF PRESENT ILLNESS: Debbie Cochran is a 68 y.o. female who presents to the clinic today for:   HPI     Retina Follow Up   Patient presents with  Other.  In left eye.  This started 1 year ago.  I, the attending physician,  performed the HPI with the patient and updated documentation appropriately.        Comments   Patient here for 1 year follow up for ERM OS. Patient states vision doing good. No eye pain. Got new glasses.      Last edited by Kassidee Narciso, MD on 06/05/2023 12:11 AM.     Patient states she is not having any problems with her vision, she saw Dr. Roslynn Coombes 2-3 weeks ago and he gave her a new glasses rx  Referring physician: Helyn Lobstein, MD [address] [place],  [place] [address]  HISTORICAL INFORMATION:   Selected notes from the MEDICAL RECORD NUMBER Referred by Dr. Allison Ivory for VMT OS LEE:  Ocular Hx- PMH-    CURRENT MEDICATIONS: No current outpatient medications on file. (Ophthalmic Drugs)   No current facility-administered medications for this visit. (Ophthalmic Drugs)   Current Outpatient Medications (Other)  Medication Sig   acetaminophen (TYLENOL) 650 MG CR tablet Take 1,300 mg by mouth daily.   acetaminophen (TYLENOL) 650 MG CR tablet Take by mouth daily. TAKE 2 DAILY   ALPRAZolam (XANAX) 0.25 MG tablet Take 0.25 mg by mouth 3 (three) times daily as needed for anxiety.    b complex vitamins tablet Take 1 tablet by mouth daily.   busPIRone (BUSPAR) 10 MG tablet 2 (two) times daily.   Calcium Carb-Cholecalciferol (CALCIUM 600 + D PO) Take 1 tablet by mouth daily.    cetirizine-pseudoephedrine (ZYRTEC-D) 5-120 MG per tablet Take 1 tablet by mouth daily as needed for allergies.   cholecalciferol (VITAMIN D) 1000 UNITS tablet Take 1,000 Units by mouth daily.   DULoxetine (CYMBALTA) 30 MG capsule daily.   DULoxetine  (CYMBALTA) 60 MG capsule Take 60 mg by mouth daily.   fluticasone (VERAMYST) 27.5 MCG/SPRAY nasal spray Place into the nose daily.   folic acid (FOLVITE) 400 MCG tablet Take 800 mcg by mouth daily.   glucosamine-chondroitin 500-400 MG tablet Take 2 tablets by mouth daily.   glucose blood (ACCU-CHEK AVIVA PLUS) test strip See admin instructions.   ibuprofen  (ADVIL ,MOTRIN ) 600 MG tablet Take 1 tablet (600 mg total) by mouth every 6 (six) hours as needed.   levothyroxine (SYNTHROID, LEVOTHROID) 75 MCG tablet Take 75 mcg by mouth daily before breakfast.   magnesium gluconate (MAGONATE) 500 MG tablet Take 400 mg by mouth in the morning and at bedtime.   metFORMIN (GLUCOPHAGE-XR) 500 MG 24 hr tablet SMARTSIG:1 Tablet(s) By Mouth Every Evening   Multiple Vitamin (MULTIVITAMIN) tablet Take 1 tablet by mouth daily.   omeprazole (PRILOSEC) 20 MG capsule Take 1 tablet by mouth daily.   Potassium 99 MG TABS 90 mg daily.   rosuvastatin (CRESTOR) 20 MG tablet Take 20 mg by mouth daily.   sodium fluoride (PREVIDENT 5000 PLUS) 1.1 % CREA dental cream    tirzepatide  (MOUNJARO ) 10 MG/0.5ML Pen Inject 10 mg into the skin once a week.   trandolapril (MAVIK) 4 MG tablet Take 0.5 tablets by mouth daily.   triamterene-hydrochlorothiazide (MAXZIDE) 75-50 MG per tablet Take 0.5 tablets  by mouth daily.   verapamil  (CALAN -SR) 240 MG CR tablet Take 240 mg by mouth daily.   Verapamil  HCl CR 300 MG CP24 Take 1 capsule (300 mg total) by mouth daily. Please schedule appointment for refills   Blood Glucose Monitoring Suppl (ACCU-CHEK AVIVA PLUS) w/Device KIT See admin instructions.   Microlet Lancets MISC See admin instructions. (Patient not taking: Reported on 05/31/2023)   vitamin C (ASCORBIC ACID) 500 MG tablet Take 500 mg by mouth daily. (Patient not taking: Reported on 10/11/2021)   Vitamin D, Cholecalciferol, 25 MCG (1000 UT) CAPS 1 capsule (Patient not taking: Reported on 05/31/2023)   Current Facility-Administered  Medications (Other)  Medication Route   0.9 %  sodium chloride  infusion Intravenous   REVIEW OF SYSTEMS: ROS   Positive for: Gastrointestinal, Neurological, Endocrine, Cardiovascular, Eyes Negative for: Constitutional, Skin, Genitourinary, Musculoskeletal, HENT, Respiratory, Psychiatric, Allergic/Imm, Heme/Lymph Last edited by Sylvan Evener, COA on 05/31/2023  8:57 AM.     ALLERGIES Allergies  Allergen Reactions   Hyoscyamine Sulfate Other (See Comments)    Other Reaction: Other reaction   Penicillins Hives and Swelling    Has patient had a PCN reaction causing immediate rash, facial/tongue/throat swelling, SOB or lightheadedness with hypotension: Yes Has patient had a PCN reaction causing severe rash involving mucus membranes or skin necrosis: No Has patient had a PCN reaction that required hospitalization No Has patient had a PCN reaction occurring within the last 10 years: No If all of the above answers are "NO", then may proceed with Cephalosporin use.    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   HYSTEROSCOPY WITH D & C N/A 02/14/2015   Procedure: DILATATION AND CURETTAGE /HYSTEROSCOPY with MYOSURE;  Surgeon: Johnn Najjar, MD;  Location: [facility];  Service: Gynecology;  Laterality: N/A;   POLYPECTOMY     TONSILLECTOMY     FAMILY HISTORY Family History  Problem Relation  Age of Onset   Dementia Mother    Breast cancer Mother 48   Dementia Father    Arthritis Father    Breast cancer Sister 18   Breast cancer Maternal Aunt 68   Colon cancer Neg Hx    Colon polyps Neg Hx    Crohn's disease Neg Hx    Esophageal cancer Neg Hx    Rectal cancer Neg Hx    Stomach cancer Neg Hx    Ulcerative colitis Neg Hx    SOCIAL HISTORY Social History   Tobacco Use   Smoking status: Former    Current packs/day: 0.00    Average packs/day: (0.4 ttl pk-yrs)    Types: Cigarettes    Start date: 06/16/1978    Quit date: 06/15/2013    Years since quitting: 9.9    Passive exposure: Never  Smokeless tobacco: Never  Vaping Use   Vaping status: Never Used  Substance Use Topics   Alcohol use: Yes    Comment: once in a while not daily   Drug use: No       OPHTHALMIC EXAM: Base Eye Exam     Visual Acuity (Snellen - Linear)       Right Left   Dist cc 20/20 -1 20/20    Correction: Glasses         Tonometry (Tonopen, 8:54 AM)       Right Left   Pressure 22 22         Pupils       Dark Light Shape React APD   Right 3 2 Round Brisk None   Left 3 2 Round Brisk None         Visual Fields (Counting fingers)       Left Right    Full Full         Extraocular Movement       Right Left    Full, Ortho Full, Ortho         Neuro/Psych     Oriented x3: Yes   Mood/Affect: Normal         Dilation     Both eyes: 1.0% Mydriacyl, 2.5% Phenylephrine @ 8:54 AM           Slit Lamp and Fundus Exam     Slit Lamp Exam       Right Left   Lids/Lashes Dermatochalasis - upper lid, mild MGD Dermatochalasis - upper lid, mild MGD   Conjunctiva/Sclera White and quiet White and quiet   Cornea mild tear film debris trace tear film debris   Anterior Chamber deep and clear deep and clear   Iris Round and dilated Round and dilated   Lens 2+ Cortical cataract, 2+ Nuclear sclerosis 2+ Cortical cataract, 2+ Nuclear sclerosis   Anterior Vitreous Vitreous  syneresis Vitreous syneresis, no pigment, Posterior vitreous detachment, prominent vitreous condensations         Fundus Exam       Right Left   Disc Pink and Sharp, mild PPP Pink and Sharp, mild PPP   C/D Ratio 0.6 0.6   Macula Flat, Good foveal reflex, RPE mottling, No heme or edema Flat, Blunted foveal reflex, ERM with trace central cystic changes   Vessels mild attenuation, mild tortuosity mild attenuation, mild tortuosity   Periphery Attached, mild paving stone degeneration inferiorly, No RT/RD, No heme Attached, hypopigmented VR tuft at 0430, mild reticular degeneration, No RT/RD, No heme           Refraction     Wearing Rx       Sphere Cylinder Axis Add   Right -0.25 +0.50 164 +2.50   Left +0.00 +0.25 022 +2.50           IMAGING AND PROCEDURES  Imaging and Procedures for 05/31/2023  OCT, Retina - OU - Both Eyes        Right Eye Quality was good. Central Foveal Thickness: 316. Progression has been stable. Findings include normal foveal contour, no IRF, no SRF, vitreomacular adhesion .   Left Eye Quality was good. Central Foveal Thickness: 329. Progression has been stable. 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   Notes  *Images captured and stored on drive  Diagnosis / Impression:  OD: NFP, no IRF/SRF OS: stable release of VMT to full PVD, persistent mild ERM with foveal notch, cystic changes / foveoschisis  Clinical management:  See below  Abbreviations: NFP - Normal foveal profile. CME - cystoid macular edema. PED - pigment epithelial detachment. IRF - intraretinal fluid. SRF - subretinal fluid. EZ - ellipsoid zone. ERM - epiretinal membrane. ORA - outer retinal atrophy. ORT - outer retinal tubulation. SRHM - subretinal hyper-reflective material. IRHM - intraretinal hyper-reflective material             ASSESSMENT/PLAN:    ICD-10-CM   1. Epiretinal membrane (ERM) of left eye  H35.372 OCT, Retina - OU - Both Eyes    2. Vitreomacular traction syndrome, left  H43.822     3. Macular cyst, hole, or pseudohole, left eye  H35.342     4. Posterior vitreous detachment of left eye  H43.812     5. Diabetes mellitus type 2 without retinopathy (HCC)  E11.9     6. Long term (current) use of oral hypoglycemic drugs  Z79.84     7. Essential hypertension  I10     8. Hypertensive retinopathy of both eyes  H35.033     9. Combined forms of age-related cataract of both eyes  H25.813       1-3. VMT + ERM w/ macular pucker and cyst OS  - pt initially reported 2-3 wk history of blurred central vision OS  - denies significant metamorphopsia - OCT shows stable release of VMT to full PVD, persistent mild ERM with foveal notch, cystic changes / foveoschisis -- no significant change from prior - discussed findings, prognosis  - no retinal or ophthalmic interventions indicated or recommended - monitor for now  - f/u 1 yr - DFE, OCT  4. PVD / vitreous syneresis OS - onset of symptomatic floaters and intermittent flashes ~early Nov 2022 -- improved, but pt reports persistent floater / smudge in vision OS  - Discussed findings and prognosis  - No RT or RD on 360 scleral depressed exam  - Reviewed s/s of RT/RD  - Strict return precautions for any such RT/RD symptoms  5,6. Diabetes mellitus, type 2 without retinopathy - The incidence, risk factors for progression, natural history and treatment options for diabetic retinopathy  were discussed with patient.   - The need for close monitoring of blood glucose, blood pressure, and serum lipids, avoiding cigarette or any type of tobacco, and the need for long term follow up was also discussed with patient. - f/u in 1 year, sooner prn  7,8. Hypertensive retinopathy OU - discussed importance of tight BP control - monitor  9. Mixed Cataract OU -  The symptoms of cataract, surgical options, and treatments and risks were discussed with patient. - discussed diagnosis and progression - now following with Dr. Roslynn Coombes   Ophthalmic Meds Ordered this visit:  No orders of the defined types were placed in this encounter.    Return in about 1 year (around 05/30/2024) for f/u ERM OS, DFE, OCT.  There are no Patient Instructions on file for this visit.  This document serves as a record of services personally performed by Jeanice Millard, MD, PhD. It was created on their behalf by Eller Gut COT, an ophthalmic technician. The creation of this record is the provider's dictation and/or activities during the visit.    Electronically signed by: Eller Gut COT 05.15.2025  12:13 AM  This document serves as a record of services personally performed by Polly Brink  Ernesta Heading, MD, PhD. It was created on their behalf by Morley Arabia. Bevin Bucks, OA an ophthalmic technician. The creation of this record is the provider's dictation and/or activities during the visit.    Electronically signed by: Morley Arabia. Bevin Bucks, OA 06/05/23 12:13 AM  Jeanice Millard, M.D., Ph.D. Diseases & Surgery of the Retina and Vitreous [facility] 05/31/2023   I have reviewed the above documentation for accuracy and completeness, and I agree with the above. Jeanice Millard, M.D., Ph.D. 06/05/23 12:14 AM   Abbreviations: M myopia (nearsighted); A astigmatism; H hyperopia (farsighted); P presbyopia; Mrx spectacle prescription;  CTL contact lenses; OD right eye; OS left eye; OU both eyes  XT exotropia; ET esotropia; PEK punctate epithelial keratitis; PEE punctate epithelial erosions; DES dry eye syndrome; MGD meibomian gland dysfunction; ATs artificial tears; PFAT's preservative free artificial tears; NSC nuclear sclerotic cataract; PSC posterior subcapsular cataract; ERM epi-retinal membrane; PVD posterior vitreous detachment; RD retinal detachment; DM diabetes  mellitus; DR diabetic retinopathy; NPDR non-proliferative diabetic retinopathy; PDR proliferative diabetic retinopathy; CSME clinically significant macular edema; DME diabetic macular edema; dbh dot blot hemorrhages; CWS cotton wool spot; POAG primary open angle glaucoma; C/D cup-to-disc ratio; HVF humphrey visual field; GVF goldmann visual field; OCT optical coherence tomography; IOP intraocular pressure; BRVO Branch retinal vein occlusion; CRVO central retinal vein occlusion; CRAO central retinal artery occlusion; BRAO branch retinal artery occlusion; RT retinal tear; SB scleral buckle; PPV pars plana vitrectomy; VH Vitreous hemorrhage; PRP panretinal laser photocoagulation; IVK intravitreal kenalog; VMT vitreomacular traction; MH Macular hole;  NVD neovascularization of the disc; NVE neovascularization elsewhere; AREDS age related eye disease study; ARMD age related macular degeneration; POAG primary open angle glaucoma; EBMD epithelial/anterior basement membrane dystrophy; ACIOL anterior chamber intraocular lens; IOL intraocular lens; PCIOL posterior chamber intraocular lens; Phaco/IOL phacoemulsification with intraocular lens placement; PRK photorefractive keratectomy; LASIK laser assisted in situ keratomileusis; HTN hypertension; DM diabetes mellitus; COPD chronic obstructive pulmonary disease

## 2023-05-31 ENCOUNTER — Ambulatory Visit (INDEPENDENT_AMBULATORY_CARE_PROVIDER_SITE_OTHER): Payer: BC Managed Care – PPO | Admitting: Ophthalmology

## 2023-05-31 ENCOUNTER — Encounter (INDEPENDENT_AMBULATORY_CARE_PROVIDER_SITE_OTHER): Payer: Self-pay | Admitting: Ophthalmology

## 2023-05-31 DIAGNOSIS — H35372 Puckering of macula, left eye: Secondary | ICD-10-CM | POA: Diagnosis not present

## 2023-05-31 DIAGNOSIS — H35342 Macular cyst, hole, or pseudohole, left eye: Secondary | ICD-10-CM

## 2023-05-31 DIAGNOSIS — H25813 Combined forms of age-related cataract, bilateral: Secondary | ICD-10-CM

## 2023-05-31 DIAGNOSIS — H43822 Vitreomacular adhesion, left eye: Secondary | ICD-10-CM

## 2023-05-31 DIAGNOSIS — Z7984 Long term (current) use of oral hypoglycemic drugs: Secondary | ICD-10-CM

## 2023-05-31 DIAGNOSIS — E119 Type 2 diabetes mellitus without complications: Secondary | ICD-10-CM | POA: Diagnosis not present

## 2023-05-31 DIAGNOSIS — I10 Essential (primary) hypertension: Secondary | ICD-10-CM | POA: Diagnosis not present

## 2023-05-31 DIAGNOSIS — H35033 Hypertensive retinopathy, bilateral: Secondary | ICD-10-CM | POA: Diagnosis not present

## 2023-05-31 DIAGNOSIS — H43812 Vitreous degeneration, left eye: Secondary | ICD-10-CM | POA: Diagnosis not present

## 2023-06-05 ENCOUNTER — Encounter (INDEPENDENT_AMBULATORY_CARE_PROVIDER_SITE_OTHER): Payer: Self-pay | Admitting: Ophthalmology

## 2023-06-05 ENCOUNTER — Other Ambulatory Visit (HOSPITAL_COMMUNITY): Payer: Self-pay

## 2023-06-30 DIAGNOSIS — R062 Wheezing: Secondary | ICD-10-CM | POA: Diagnosis not present

## 2023-06-30 DIAGNOSIS — J40 Bronchitis, not specified as acute or chronic: Secondary | ICD-10-CM | POA: Diagnosis not present

## 2023-06-30 DIAGNOSIS — R509 Fever, unspecified: Secondary | ICD-10-CM | POA: Diagnosis not present

## 2023-06-30 DIAGNOSIS — R051 Acute cough: Secondary | ICD-10-CM | POA: Diagnosis not present

## 2023-07-02 ENCOUNTER — Other Ambulatory Visit: Payer: Self-pay

## 2023-07-02 ENCOUNTER — Other Ambulatory Visit (HOSPITAL_COMMUNITY): Payer: Self-pay

## 2023-07-02 DIAGNOSIS — E782 Mixed hyperlipidemia: Secondary | ICD-10-CM | POA: Diagnosis not present

## 2023-07-02 DIAGNOSIS — E1159 Type 2 diabetes mellitus with other circulatory complications: Secondary | ICD-10-CM | POA: Diagnosis not present

## 2023-07-02 DIAGNOSIS — K219 Gastro-esophageal reflux disease without esophagitis: Secondary | ICD-10-CM | POA: Diagnosis not present

## 2023-07-02 DIAGNOSIS — E039 Hypothyroidism, unspecified: Secondary | ICD-10-CM | POA: Diagnosis not present

## 2023-07-02 DIAGNOSIS — F418 Other specified anxiety disorders: Secondary | ICD-10-CM | POA: Diagnosis not present

## 2023-07-02 DIAGNOSIS — E559 Vitamin D deficiency, unspecified: Secondary | ICD-10-CM | POA: Diagnosis not present

## 2023-07-02 DIAGNOSIS — Z6835 Body mass index (BMI) 35.0-35.9, adult: Secondary | ICD-10-CM | POA: Diagnosis not present

## 2023-07-02 DIAGNOSIS — I10 Essential (primary) hypertension: Secondary | ICD-10-CM | POA: Diagnosis not present

## 2023-07-02 DIAGNOSIS — Z9884 Bariatric surgery status: Secondary | ICD-10-CM | POA: Diagnosis not present

## 2023-07-02 DIAGNOSIS — D508 Other iron deficiency anemias: Secondary | ICD-10-CM | POA: Diagnosis not present

## 2023-07-02 DIAGNOSIS — K5900 Constipation, unspecified: Secondary | ICD-10-CM | POA: Diagnosis not present

## 2023-07-02 MED ORDER — MOUNJARO 10 MG/0.5ML ~~LOC~~ SOAJ
10.0000 mg | SUBCUTANEOUS | 0 refills | Status: DC
  Filled 2023-07-02: qty 2, 28d supply, fill #0

## 2023-07-29 ENCOUNTER — Other Ambulatory Visit (HOSPITAL_COMMUNITY): Payer: Self-pay

## 2023-07-29 MED ORDER — MOUNJARO 10 MG/0.5ML ~~LOC~~ SOAJ
10.0000 mg | SUBCUTANEOUS | 0 refills | Status: DC
  Filled 2023-07-29: qty 2, 28d supply, fill #0

## 2023-07-31 DIAGNOSIS — F331 Major depressive disorder, recurrent, moderate: Secondary | ICD-10-CM | POA: Diagnosis not present

## 2023-07-31 DIAGNOSIS — F411 Generalized anxiety disorder: Secondary | ICD-10-CM | POA: Diagnosis not present

## 2023-08-14 DIAGNOSIS — Z9884 Bariatric surgery status: Secondary | ICD-10-CM | POA: Diagnosis not present

## 2023-08-14 DIAGNOSIS — E782 Mixed hyperlipidemia: Secondary | ICD-10-CM | POA: Diagnosis not present

## 2023-08-14 DIAGNOSIS — K5900 Constipation, unspecified: Secondary | ICD-10-CM | POA: Diagnosis not present

## 2023-08-14 DIAGNOSIS — F418 Other specified anxiety disorders: Secondary | ICD-10-CM | POA: Diagnosis not present

## 2023-08-14 DIAGNOSIS — I10 Essential (primary) hypertension: Secondary | ICD-10-CM | POA: Diagnosis not present

## 2023-08-14 DIAGNOSIS — Z6833 Body mass index (BMI) 33.0-33.9, adult: Secondary | ICD-10-CM | POA: Diagnosis not present

## 2023-08-14 DIAGNOSIS — E559 Vitamin D deficiency, unspecified: Secondary | ICD-10-CM | POA: Diagnosis not present

## 2023-08-14 DIAGNOSIS — E039 Hypothyroidism, unspecified: Secondary | ICD-10-CM | POA: Diagnosis not present

## 2023-08-14 DIAGNOSIS — K219 Gastro-esophageal reflux disease without esophagitis: Secondary | ICD-10-CM | POA: Diagnosis not present

## 2023-08-14 DIAGNOSIS — E1159 Type 2 diabetes mellitus with other circulatory complications: Secondary | ICD-10-CM | POA: Diagnosis not present

## 2023-08-14 DIAGNOSIS — D508 Other iron deficiency anemias: Secondary | ICD-10-CM | POA: Diagnosis not present

## 2023-08-15 ENCOUNTER — Other Ambulatory Visit (HOSPITAL_COMMUNITY): Payer: Self-pay

## 2023-08-15 DIAGNOSIS — H6691 Otitis media, unspecified, right ear: Secondary | ICD-10-CM | POA: Diagnosis not present

## 2023-08-15 DIAGNOSIS — R053 Chronic cough: Secondary | ICD-10-CM | POA: Diagnosis not present

## 2023-08-15 DIAGNOSIS — J019 Acute sinusitis, unspecified: Secondary | ICD-10-CM | POA: Diagnosis not present

## 2023-08-15 MED ORDER — MOUNJARO 10 MG/0.5ML ~~LOC~~ SOAJ
10.0000 mg | SUBCUTANEOUS | 0 refills | Status: AC
  Filled 2023-08-15 – 2023-08-26 (×2): qty 2, 28d supply, fill #0

## 2023-08-20 DIAGNOSIS — Z0000 Encounter for general adult medical examination without abnormal findings: Secondary | ICD-10-CM | POA: Diagnosis not present

## 2023-08-26 ENCOUNTER — Other Ambulatory Visit: Payer: Self-pay

## 2023-08-26 ENCOUNTER — Other Ambulatory Visit (HOSPITAL_COMMUNITY): Payer: Self-pay

## 2023-08-27 DIAGNOSIS — Z79899 Other long term (current) drug therapy: Secondary | ICD-10-CM | POA: Diagnosis not present

## 2023-08-27 DIAGNOSIS — E782 Mixed hyperlipidemia: Secondary | ICD-10-CM | POA: Diagnosis not present

## 2023-08-27 DIAGNOSIS — D508 Other iron deficiency anemias: Secondary | ICD-10-CM | POA: Diagnosis not present

## 2023-08-27 DIAGNOSIS — E039 Hypothyroidism, unspecified: Secondary | ICD-10-CM | POA: Diagnosis not present

## 2023-08-27 DIAGNOSIS — E1159 Type 2 diabetes mellitus with other circulatory complications: Secondary | ICD-10-CM | POA: Diagnosis not present

## 2023-08-28 ENCOUNTER — Other Ambulatory Visit (HOSPITAL_COMMUNITY): Payer: Self-pay

## 2023-08-28 MED ORDER — MOUNJARO 10 MG/0.5ML ~~LOC~~ SOAJ
10.0000 mg | SUBCUTANEOUS | 0 refills | Status: DC
  Filled 2023-08-28 (×2): qty 2, 28d supply, fill #0

## 2023-08-29 ENCOUNTER — Other Ambulatory Visit (HOSPITAL_COMMUNITY): Payer: Self-pay

## 2023-09-06 DIAGNOSIS — H6691 Otitis media, unspecified, right ear: Secondary | ICD-10-CM | POA: Diagnosis not present

## 2023-09-06 DIAGNOSIS — E782 Mixed hyperlipidemia: Secondary | ICD-10-CM | POA: Diagnosis not present

## 2023-09-06 DIAGNOSIS — D508 Other iron deficiency anemias: Secondary | ICD-10-CM | POA: Diagnosis not present

## 2023-09-06 DIAGNOSIS — Z6834 Body mass index (BMI) 34.0-34.9, adult: Secondary | ICD-10-CM | POA: Diagnosis not present

## 2023-09-06 DIAGNOSIS — F331 Major depressive disorder, recurrent, moderate: Secondary | ICD-10-CM | POA: Diagnosis not present

## 2023-09-06 DIAGNOSIS — E039 Hypothyroidism, unspecified: Secondary | ICD-10-CM | POA: Diagnosis not present

## 2023-09-06 DIAGNOSIS — I10 Essential (primary) hypertension: Secondary | ICD-10-CM | POA: Diagnosis not present

## 2023-09-06 DIAGNOSIS — E1159 Type 2 diabetes mellitus with other circulatory complications: Secondary | ICD-10-CM | POA: Diagnosis not present

## 2023-09-06 DIAGNOSIS — E559 Vitamin D deficiency, unspecified: Secondary | ICD-10-CM | POA: Diagnosis not present

## 2023-09-06 DIAGNOSIS — D751 Secondary polycythemia: Secondary | ICD-10-CM | POA: Diagnosis not present

## 2023-09-25 ENCOUNTER — Other Ambulatory Visit (HOSPITAL_COMMUNITY): Payer: Self-pay

## 2023-09-25 DIAGNOSIS — E1159 Type 2 diabetes mellitus with other circulatory complications: Secondary | ICD-10-CM | POA: Diagnosis not present

## 2023-09-25 DIAGNOSIS — Z6833 Body mass index (BMI) 33.0-33.9, adult: Secondary | ICD-10-CM | POA: Diagnosis not present

## 2023-09-25 DIAGNOSIS — K5900 Constipation, unspecified: Secondary | ICD-10-CM | POA: Diagnosis not present

## 2023-09-25 DIAGNOSIS — Z9884 Bariatric surgery status: Secondary | ICD-10-CM | POA: Diagnosis not present

## 2023-09-25 DIAGNOSIS — E039 Hypothyroidism, unspecified: Secondary | ICD-10-CM | POA: Diagnosis not present

## 2023-09-25 DIAGNOSIS — K219 Gastro-esophageal reflux disease without esophagitis: Secondary | ICD-10-CM | POA: Diagnosis not present

## 2023-09-25 DIAGNOSIS — E559 Vitamin D deficiency, unspecified: Secondary | ICD-10-CM | POA: Diagnosis not present

## 2023-09-25 DIAGNOSIS — D508 Other iron deficiency anemias: Secondary | ICD-10-CM | POA: Diagnosis not present

## 2023-09-25 DIAGNOSIS — E782 Mixed hyperlipidemia: Secondary | ICD-10-CM | POA: Diagnosis not present

## 2023-09-25 DIAGNOSIS — F418 Other specified anxiety disorders: Secondary | ICD-10-CM | POA: Diagnosis not present

## 2023-09-25 DIAGNOSIS — I10 Essential (primary) hypertension: Secondary | ICD-10-CM | POA: Diagnosis not present

## 2023-09-25 MED ORDER — MOUNJARO 12.5 MG/0.5ML ~~LOC~~ SOAJ
12.5000 mg | SUBCUTANEOUS | 0 refills | Status: DC
  Filled 2023-09-25: qty 2, 28d supply, fill #0

## 2023-10-23 ENCOUNTER — Other Ambulatory Visit (HOSPITAL_COMMUNITY): Payer: Self-pay

## 2023-10-23 MED ORDER — MOUNJARO 12.5 MG/0.5ML ~~LOC~~ SOAJ
12.5000 mg | SUBCUTANEOUS | 0 refills | Status: DC
  Filled 2023-10-23: qty 2, 28d supply, fill #0

## 2023-10-30 DIAGNOSIS — F331 Major depressive disorder, recurrent, moderate: Secondary | ICD-10-CM | POA: Diagnosis not present

## 2023-10-30 DIAGNOSIS — F411 Generalized anxiety disorder: Secondary | ICD-10-CM | POA: Diagnosis not present

## 2023-11-06 DIAGNOSIS — E782 Mixed hyperlipidemia: Secondary | ICD-10-CM | POA: Diagnosis not present

## 2023-11-06 DIAGNOSIS — D508 Other iron deficiency anemias: Secondary | ICD-10-CM | POA: Diagnosis not present

## 2023-11-06 DIAGNOSIS — E559 Vitamin D deficiency, unspecified: Secondary | ICD-10-CM | POA: Diagnosis not present

## 2023-11-06 DIAGNOSIS — Z6832 Body mass index (BMI) 32.0-32.9, adult: Secondary | ICD-10-CM | POA: Diagnosis not present

## 2023-11-06 DIAGNOSIS — E1159 Type 2 diabetes mellitus with other circulatory complications: Secondary | ICD-10-CM | POA: Diagnosis not present

## 2023-11-06 DIAGNOSIS — F418 Other specified anxiety disorders: Secondary | ICD-10-CM | POA: Diagnosis not present

## 2023-11-06 DIAGNOSIS — Z9884 Bariatric surgery status: Secondary | ICD-10-CM | POA: Diagnosis not present

## 2023-11-06 DIAGNOSIS — K219 Gastro-esophageal reflux disease without esophagitis: Secondary | ICD-10-CM | POA: Diagnosis not present

## 2023-11-06 DIAGNOSIS — K5900 Constipation, unspecified: Secondary | ICD-10-CM | POA: Diagnosis not present

## 2023-11-06 DIAGNOSIS — I10 Essential (primary) hypertension: Secondary | ICD-10-CM | POA: Diagnosis not present

## 2023-11-06 DIAGNOSIS — E039 Hypothyroidism, unspecified: Secondary | ICD-10-CM | POA: Diagnosis not present

## 2023-11-18 ENCOUNTER — Other Ambulatory Visit (HOSPITAL_COMMUNITY): Payer: Self-pay

## 2023-11-18 MED ORDER — FREESTYLE LIBRE 3 PLUS SENSOR MISC
3 refills | Status: AC
  Filled 2023-11-18: qty 2, 30d supply, fill #0

## 2023-11-18 MED ORDER — MOUNJARO 12.5 MG/0.5ML ~~LOC~~ SOAJ
12.5000 mg | SUBCUTANEOUS | 1 refills | Status: DC
  Filled 2023-11-18: qty 2, 28d supply, fill #0
  Filled 2023-12-19: qty 2, 28d supply, fill #1

## 2023-12-19 ENCOUNTER — Other Ambulatory Visit (HOSPITAL_COMMUNITY): Payer: Self-pay

## 2023-12-27 ENCOUNTER — Other Ambulatory Visit (HOSPITAL_COMMUNITY): Payer: Self-pay

## 2023-12-27 MED ORDER — MOUNJARO 12.5 MG/0.5ML ~~LOC~~ SOAJ
12.5000 mg | SUBCUTANEOUS | 1 refills | Status: AC
  Filled 2023-12-27 – 2024-01-10 (×2): qty 2, 28d supply, fill #0

## 2024-01-02 ENCOUNTER — Other Ambulatory Visit (HOSPITAL_COMMUNITY): Payer: Self-pay

## 2024-01-10 ENCOUNTER — Other Ambulatory Visit (HOSPITAL_COMMUNITY): Payer: Self-pay

## 2024-02-13 ENCOUNTER — Other Ambulatory Visit (HOSPITAL_COMMUNITY): Payer: Self-pay

## 2024-02-13 MED ORDER — MOUNJARO 12.5 MG/0.5ML ~~LOC~~ SOAJ
12.5000 mg | SUBCUTANEOUS | 1 refills | Status: AC
  Filled 2024-02-13 – 2024-02-17 (×2): qty 2, 28d supply, fill #0

## 2024-02-14 ENCOUNTER — Other Ambulatory Visit (HOSPITAL_COMMUNITY): Payer: Self-pay

## 2024-02-17 ENCOUNTER — Other Ambulatory Visit (HOSPITAL_COMMUNITY): Payer: Self-pay

## 2024-02-18 ENCOUNTER — Other Ambulatory Visit (HOSPITAL_COMMUNITY): Payer: Self-pay

## 2024-11-30 ENCOUNTER — Encounter (INDEPENDENT_AMBULATORY_CARE_PROVIDER_SITE_OTHER): Admitting: Ophthalmology
